# Patient Record
Sex: Female | Born: 1975 | Race: White | Hispanic: No | Marital: Single | Smoking: Former smoker
Health system: Southern US, Community
[De-identification: ages and names within clinical notes are randomized; demographics above are authoritative.]

## PROBLEM LIST (undated history)

## (undated) DIAGNOSIS — Z972 Presence of dental prosthetic device (complete) (partial): Secondary | ICD-10-CM

## (undated) DIAGNOSIS — N200 Calculus of kidney: Secondary | ICD-10-CM

## (undated) DIAGNOSIS — E079 Disorder of thyroid, unspecified: Secondary | ICD-10-CM

## (undated) HISTORY — PX: ABDOMINAL HYSTERECTOMY: SHX81

## (undated) HISTORY — PX: TUBAL LIGATION: SHX77

## (undated) HISTORY — PX: SPLENECTOMY: SUR1306

## (undated) HISTORY — DX: Calculus of kidney: N20.0

## (undated) HISTORY — PX: LITHOTRIPSY: SUR834

---

## 2004-12-23 ENCOUNTER — Emergency Department: Payer: Self-pay | Admitting: Emergency Medicine

## 2005-08-14 ENCOUNTER — Emergency Department: Payer: Self-pay | Admitting: Unknown Physician Specialty

## 2006-06-15 ENCOUNTER — Ambulatory Visit: Payer: Self-pay | Admitting: Unknown Physician Specialty

## 2007-11-28 ENCOUNTER — Ambulatory Visit: Payer: Self-pay | Admitting: *Deleted

## 2008-01-02 ENCOUNTER — Ambulatory Visit: Payer: Self-pay | Admitting: Obstetrics & Gynecology

## 2008-01-11 ENCOUNTER — Ambulatory Visit: Payer: Self-pay | Admitting: Obstetrics & Gynecology

## 2008-01-28 ENCOUNTER — Emergency Department: Payer: Self-pay | Admitting: Emergency Medicine

## 2008-02-02 ENCOUNTER — Ambulatory Visit: Payer: Self-pay | Admitting: Urology

## 2008-05-17 ENCOUNTER — Emergency Department: Payer: Self-pay | Admitting: Emergency Medicine

## 2008-05-27 ENCOUNTER — Emergency Department: Payer: Self-pay | Admitting: Emergency Medicine

## 2008-06-28 ENCOUNTER — Ambulatory Visit: Payer: Self-pay | Admitting: Urology

## 2008-11-16 DIAGNOSIS — N200 Calculus of kidney: Secondary | ICD-10-CM

## 2008-11-16 HISTORY — DX: Calculus of kidney: N20.0

## 2009-01-24 ENCOUNTER — Emergency Department: Payer: Self-pay | Admitting: Emergency Medicine

## 2009-05-07 ENCOUNTER — Emergency Department: Payer: Self-pay | Admitting: Emergency Medicine

## 2009-07-30 ENCOUNTER — Emergency Department: Payer: Self-pay | Admitting: Emergency Medicine

## 2011-02-11 ENCOUNTER — Emergency Department: Payer: Self-pay | Admitting: Emergency Medicine

## 2012-04-12 ENCOUNTER — Emergency Department: Payer: Self-pay | Admitting: Emergency Medicine

## 2012-07-29 ENCOUNTER — Emergency Department: Payer: Self-pay | Admitting: Emergency Medicine

## 2012-07-29 LAB — COMPREHENSIVE METABOLIC PANEL
Albumin: 4.1 g/dL (ref 3.4–5.0)
Bilirubin,Total: 0.6 mg/dL (ref 0.2–1.0)
Chloride: 107 mmol/L (ref 98–107)
Co2: 23 mmol/L (ref 21–32)
Creatinine: 0.54 mg/dL — ABNORMAL LOW (ref 0.60–1.30)
EGFR (African American): >60
EGFR (Non-African Amer.): >60
Osmolality: 277 (ref 275–301)
Potassium: 4.3 mmol/L (ref 3.5–5.1)
SGPT (ALT): 23 U/L (ref 12–78)
Sodium: 140 mmol/L (ref 136–145)
Total Protein: 7.2 g/dL (ref 6.4–8.2)

## 2012-07-29 LAB — URINALYSIS, COMPLETE
Bilirubin,UR: NEGATIVE
Nitrite: NEGATIVE
Ph: 6 (ref 4.5–8.0)
Protein: NEGATIVE
RBC,UR: 1 /HPF (ref 0–5)
Squamous Epithelial: 2

## 2012-07-29 LAB — CBC
MCHC: 35.7 g/dL (ref 32.0–36.0)
MCV: 80 fL (ref 80–100)
Platelet: 581 10*3/uL — ABNORMAL HIGH (ref 150–440)
RDW: 15.1 % — ABNORMAL HIGH (ref 11.5–14.5)
WBC: 15.7 10*3/uL — ABNORMAL HIGH (ref 3.6–11.0)

## 2013-01-03 ENCOUNTER — Ambulatory Visit: Payer: Self-pay | Admitting: Obstetrics & Gynecology

## 2013-01-03 LAB — CBC
HCT: 34.1 % — ABNORMAL LOW (ref 35.0–47.0)
HGB: 11.7 g/dL — ABNORMAL LOW (ref 12.0–16.0)
MCH: 24.3 pg — ABNORMAL LOW (ref 26.0–34.0)
MCHC: 34.3 g/dL (ref 32.0–36.0)
MCV: 71 fL — ABNORMAL LOW (ref 80–100)
Platelet: 677 10*3/uL — ABNORMAL HIGH (ref 150–440)
RBC: 4.8 10*6/uL (ref 3.80–5.20)
RDW: 17.1 % — ABNORMAL HIGH (ref 11.5–14.5)
WBC: 11.7 10*3/uL — ABNORMAL HIGH (ref 3.6–11.0)

## 2013-01-10 ENCOUNTER — Ambulatory Visit: Payer: Self-pay | Admitting: Obstetrics & Gynecology

## 2013-01-11 LAB — PATHOLOGY REPORT

## 2013-02-15 ENCOUNTER — Emergency Department: Payer: Self-pay | Admitting: Emergency Medicine

## 2013-02-15 LAB — COMPREHENSIVE METABOLIC PANEL
Albumin: 3.5 g/dL (ref 3.4–5.0)
Alkaline Phosphatase: 86 U/L (ref 50–136)
Anion Gap: 9 (ref 7–16)
BUN: 6 mg/dL — ABNORMAL LOW (ref 7–18)
Bilirubin,Total: 0.3 mg/dL (ref 0.2–1.0)
Calcium, Total: 9 mg/dL (ref 8.5–10.1)
Creatinine: 0.45 mg/dL — ABNORMAL LOW (ref 0.60–1.30)
EGFR (African American): >60
EGFR (Non-African Amer.): >60
Potassium: 3.8 mmol/L (ref 3.5–5.1)
SGOT(AST): 22 U/L (ref 15–37)
Sodium: 137 mmol/L (ref 136–145)
Total Protein: 7.1 g/dL (ref 6.4–8.2)

## 2013-02-15 LAB — LIPASE, BLOOD: Lipase: 109 U/L (ref 73–393)

## 2013-02-15 LAB — URINALYSIS, COMPLETE
Bilirubin,UR: NEGATIVE
Glucose,UR: NEGATIVE mg/dL (ref 0–75)
Nitrite: NEGATIVE
Ph: 7 (ref 4.5–8.0)
Protein: NEGATIVE
RBC,UR: 1 /HPF (ref 0–5)
Specific Gravity: 1.006 (ref 1.003–1.030)
Squamous Epithelial: 2
WBC UR: 7 /HPF (ref 0–5)

## 2013-02-15 LAB — CBC
MCH: 24.9 pg — ABNORMAL LOW (ref 26.0–34.0)
MCHC: 34.6 g/dL (ref 32.0–36.0)
MCV: 72 fL — ABNORMAL LOW (ref 80–100)
Platelet: 550 10*3/uL — ABNORMAL HIGH (ref 150–440)
RBC: 4.83 10*6/uL (ref 3.80–5.20)
WBC: 15.2 10*3/uL — ABNORMAL HIGH (ref 3.6–11.0)

## 2013-02-20 ENCOUNTER — Emergency Department: Payer: Self-pay | Admitting: Emergency Medicine

## 2013-02-20 LAB — BASIC METABOLIC PANEL
Anion Gap: 6 — ABNORMAL LOW (ref 7–16)
BUN: 9 mg/dL (ref 7–18)
Calcium, Total: 9.2 mg/dL (ref 8.5–10.1)
Chloride: 107 mmol/L (ref 98–107)
Creatinine: 0.59 mg/dL — ABNORMAL LOW (ref 0.60–1.30)
EGFR (African American): >60
EGFR (Non-African Amer.): >60
Glucose: 96 mg/dL (ref 65–99)
Osmolality: 272 (ref 275–301)
Potassium: 4.1 mmol/L (ref 3.5–5.1)

## 2013-02-20 LAB — CBC
HCT: 41.1 % (ref 35.0–47.0)
HGB: 14 g/dL (ref 12.0–16.0)
MCH: 24.5 pg — ABNORMAL LOW (ref 26.0–34.0)
MCHC: 34.2 g/dL (ref 32.0–36.0)
MCV: 72 fL — ABNORMAL LOW (ref 80–100)
RBC: 5.74 10*6/uL — ABNORMAL HIGH (ref 3.80–5.20)
RDW: 19 % — ABNORMAL HIGH (ref 11.5–14.5)

## 2013-02-21 LAB — URINALYSIS, COMPLETE
Bacteria: NONE SEEN
Bilirubin,UR: NEGATIVE
Ketone: NEGATIVE
Nitrite: NEGATIVE
Ph: 7 (ref 4.5–8.0)
Protein: NEGATIVE
RBC,UR: 16 /HPF (ref 0–5)
Specific Gravity: 1.013 (ref 1.003–1.030)

## 2013-02-21 LAB — WET PREP, GENITAL

## 2015-03-08 NOTE — Op Note (Signed)
PATIENT NAME:  Mallory Deleon, Mallory Deleon MR#:  [id number] DATE OF BIRTH:  1976-05-20  DATE OF PROCEDURE:  01/10/2013  PREOPERATIVE DIAGNOSES: Pelvic pain, menorrhagia, paratubal cyst, family history of cervical cancer.   POSTOPERATIVE DIAGNOSES: Pelvic pain, menorrhagia, paratubal cyst, family history of cervical cancer.   PROCEDURE PERFORMED: Complete laparoscopic hysterectomy, and right salpingectomy.   SURGEON: Harris.   ANESTHESIA: General.   BLOOD LOSS: 25 mL.    COMPLICATIONS: None.   FINDINGS: Cystic right tube, absent left tube and ovary. Normal right ovary, appendix, gallbladder and liver.   DISPOSITION: To the recovery room in stable condition.   TECHNIQUE: The patient was prepped and draped in the usual sterile fashion after adequate anesthesia was obtained in the dorsal lithotomy position. A Foley catheter was inserted. The cervix was visualized and grasped with a tenaculum. A VCare device was placed on the cervix after the uterus was sounded to 8 cm for manipulation purposes.   Attention was then turned to the abdomen, where a Veress needle was inserted through a 5-mm  infraumbilical incision after Marcaine was used to anesthetize the skin. Veress needle placement was confirmed using the hanging-drop technique, and the abdomen was then insufflated with CO2 gas. A 5-mm  trocar was then inserted under direct visualization with the laparoscope, with no injuries or bleeding noted. The patient was noted to have some omental adhesions around the area of the trocar placement, but no bowel is in this area and there is no apparent bowel injury.   The patient was placed in Trendelenburg positioning.   An 11-mm  trocar was placed in the right lower quadrant and a 5-mm trocar was placed in the left lower quadrant lateral to the inferior epigastric blood vessels, with no injuries or bleeding noted. The camera was moved into the left lower quadrant trocar site and was used to visualize the umbilical  trocar entry-point and the omental adhesions. Using the 5-mm  Harmonic scalpel, a lysis of adhesions was performed without complication or bleeding problems.   The camera was returned to the midline port, and the above-mentioned findings are visualized with the uterus and adnexa. The right fallopian tube was grasped and carefully dissected along its mesosalpinx to preserve blood supply to the right ovary. The round ligaments were carefully coagulated and transected using the 5-mm Harmonic scalpel, and the uteroovarian blood vessels and ligaments on both sides were carefully coagulated and transected with dissection down the level of the broad ligament complex to the level of the uterine arteries. The uterine arteries were then carefully coagulated using bipolar cautery device, and incised. Careful dissection around the circumference of the uterus was performed with inferior retraction and dissection of the bladder. 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 It was then removed vaginally.   Using the Endo Stitch device the vaginal cuff was then closed with 6 interrupted sutures, with excellent closure noted by visualization as well as palpation by bimanual examination. The pelvic cavity was irrigated, with aspiration of all fluid, blood clot, and excellent hemostasis noted. There was no apparent injury or problem with ureter, bowel, or bladder organs.   The patient was leveled. Gas was expelled. Trocars were removed. The skin was closed with Dermabond. Foley catheter was left in place. The patient went to the recovery room in stable condition.   All sponge,  instrument, and needle counts were correct at the conclusion of the case.     ____________________________ R. Barnett Applebaum, MD rph:dm D: 01/10/2013 11:35:00  ET T: 01/10/2013 12:34:44 ET JOB#: [id number]  cc: Glean Salen, MD, <Dictator> Gae Dry MD ELECTRONICALLY SIGNED 01/12/2013 8:59

## 2016-06-30 DIAGNOSIS — R197 Diarrhea, unspecified: Secondary | ICD-10-CM | POA: Insufficient documentation

## 2017-03-12 ENCOUNTER — Encounter: Payer: Self-pay | Admitting: Nurse Practitioner

## 2017-03-12 ENCOUNTER — Ambulatory Visit (INDEPENDENT_AMBULATORY_CARE_PROVIDER_SITE_OTHER): Payer: 59 | Admitting: Nurse Practitioner

## 2017-03-12 VITALS — BP 128/83 | HR 77 | Temp 98.3°F | Resp 16 | Ht 59.5 in | Wt 178.6 lb

## 2017-03-12 DIAGNOSIS — Z720 Tobacco use: Secondary | ICD-10-CM | POA: Diagnosis not present

## 2017-03-12 DIAGNOSIS — R0789 Other chest pain: Secondary | ICD-10-CM | POA: Diagnosis not present

## 2017-03-12 DIAGNOSIS — Z716 Tobacco abuse counseling: Secondary | ICD-10-CM | POA: Diagnosis not present

## 2017-03-12 DIAGNOSIS — G5603 Carpal tunnel syndrome, bilateral upper limbs: Secondary | ICD-10-CM

## 2017-03-12 DIAGNOSIS — Z7689 Persons encountering health services in other specified circumstances: Secondary | ICD-10-CM

## 2017-03-12 NOTE — Progress Notes (Signed)
Subjective:    Patient ID: Mallory Deleon, female    DOB: 03-19-1976, 41 y.o.   MRN: [id number]  Mallory Deleon is a 41 y.o. female presenting on 03/12/2017 for New Patient (Initial Visit) (Patient here to establish care. Patient reports feeling fairly well. Patient reports bilateral hand pain and numbness x's several years. ) and Chest Pain (Patient reports chest pain on and off for about one year. 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  She notices it most with repetitive movements.  She denies burning or shooting pain or any neck pain.  Chest pain Feels this sharp to aching pain at the bottom of her sternum when she slouches and it goes away when she stretches.  Stretches then hears a pop, then stops hurting.  This has been happening for over 1 year.  Some palpitations when she drinks mountain dew. Recent cardiac workup "ok".  No changes in the pain character with upper arm movements.  No pattern to the onset or experience of pain except for it occurring at times when she has a slouching posture.  The pain can lasts for seconds to days.  She notices it "needs to pop" to go away.  It is not tender.  Yoga stretching does improve frequency and shortens length of duration of the musculoskeletal pain.   Past Medical History:  Diagnosis Date  . Kidney stones 2010   Past Surgical History:  Procedure Laterality Date  . ABDOMINAL HYSTERECTOMY    . LITHOTRIPSY Bilateral   . SPLENECTOMY    . TUBAL LIGATION     Social History   Social History  . Marital status: Single    Spouse name: N/A  . Number of children: 3  . Years of education: N/A   Occupational History  . Not on file.     Social History Main Topics  . Smoking status: Current Every Day Smoker    Packs/day: 1.50    Years: 25.00    Types: Cigarettes  . Smokeless tobacco: Never Used     Comment: smokes when she is bored and stressed  . Alcohol use No     Comment: rarely  . Drug use: Yes    Frequency: 6.0 times per week    Types: Marijuana  . Sexual activity: Yes   Other Topics Concern  . Not on file   Social History Narrative  . No narrative on file   Family History  Problem Relation Age of Onset  . Diabetes Mother   . Diabetes Father   . Heart disease Father     CABGx3  . Thyroid disease Sister     hypothyroid  . Hereditary spherocytosis Daughter   . Hereditary spherocytosis Son    No current outpatient prescriptions on file prior to visit.   No current facility-administered medications on file prior to visit.     Review of Systems Per HPI unless specifically indicated above     Objective:    BP 128/83 (BP Location: Left Arm, Patient Position: Sitting, Cuff Size: Large)   Pulse 77   Temp 98.3 F (36.8 C) (Oral)   Resp 16  Ht 4' 11.5" (1.511 m)   Wt 178 lb 9.6 oz (81 kg)   SpO2 98%   BMI 35.47 kg/m   Wt Readings from Last 3 Encounters:  03/12/17 178 lb 9.6 oz (81 kg)    Physical Exam  Constitutional: She is oriented to person, place, and time. She appears well-developed and well-nourished. No distress.  HENT:  Head: Normocephalic and atraumatic.  Right Ear: Tympanic membrane, external ear and ear canal normal.  Left Ear: Tympanic membrane, external ear and ear canal normal.  Nose: Nose normal.  Mouth/Throat: Oropharynx is clear and moist.  Eyes: Conjunctivae and EOM are normal. Pupils are equal, round, and reactive to light.  Neck: Normal range of motion. No JVD present. No tracheal deviation present.  Cardiovascular: Normal rate, regular rhythm, normal heart sounds and intact distal pulses.   Pulmonary/Chest: Effort normal and breath sounds normal. No respiratory  distress. She has no wheezes. She has no rales. She exhibits no mass, no tenderness, no bony tenderness, no crepitus, no edema, no deformity, no swelling and no retraction.  Abdominal: Soft. Bowel sounds are normal.  Musculoskeletal: Normal range of motion.  R hand: tinel positive (dominant hand) L hand: tinel negative Phalen test: hands started tingling  Lymphadenopathy:    She has no cervical adenopathy.  Neurological: She is alert and oriented to person, place, and time. No cranial nerve deficit. Coordination normal.  Skin: Skin is warm and dry.  Psychiatric: She has a normal mood and affect. Her behavior is normal. Judgment and thought content normal.  Vitals reviewed.       Assessment & Plan:   Problem List Items Addressed This Visit    None    Visit Diagnoses    Musculoskeletal chest pain    -  Primary Stable and not worsening musculoskeletal pain.  Posture precipitates pain.  Pain resolves after popping sound at sternum and ribcage.  Plan: 1. Yoga stretches help relieve pain.  Continue. 2. Reviewed cardiac emergencies and instructed to have patient seek care via EMS.  If character changes without cardiac symptoms, ok to seek care in clinic.    Encounter to establish care with new doctor     Reviewed medical history, family history, social history.  Encounter for smoking cessation Ready to quit smoking.  Pt stated quit date set for June 13, 2017.  Discussion today >10 minutes specifically on counseling on risks of tobacco use, complications, treatment, smoking cessation. Plan:  1. Discussed tips for quitting and discussed pharmaceutical options to help quitting if unable to do it without.   2. Plan for Wellbutrin if needing pharmaceutical assistance.    Bilateral carpal tunnel syndrome  Worsening progression over recent months.  Problem onset about 8 years ago.  Plan: 1. Initiate NSAID and other antiinflammatory therapy with heat/ice. 2. Initiate OT referral and  treatment as needed ([facility] order provided). 3. Consider Orthopedic Surgery referral for discussion r/t splinting vs surgical release.          Meds ordered this encounter  Medications  . ibuprofen (ADVIL,MOTRIN) 200 MG tablet    Sig: Take 200 mg by mouth as needed for mild pain.      Follow up plan: Return in about 1 month (around 04/11/2017) for 1-2 months for annual physical.  Cassell Smiles, DNP, AGPCNP-BC Adult Gerontology Primary Care Nurse Practitioner [facility] 03/14/2017, 9:23 PM

## 2017-03-12 NOTE — Patient Instructions (Signed)
Ave, Thank you for coming in to clinic today.  1. To quit smoking, set quit date. Work toward reducing by half if you can't completely quit.  July 29th.    2. For your carpal tunnel Right hand. - take ibuprofen 600 mg every 8 hours with food. - OT referral has been placed. - Consider orthopedic surgery referral for discussions about splinting vs. Surgery.  3. FOr your chest/sternum pain - Keep doing your stretches with yoga. - If the pain character changes, seek care here, or via 9-1-1 for chest pressure, sweating, lightheadedness, arm pain, back pain, or other jaw pain.  It could be cardiac.   Please schedule a follow-up appointment with Cassell Smiles, AGNP in 1 months for your annual physical.  If you have any other questions or concerns, please feel free to call the clinic or send a message through [facility]. You may also schedule an earlier appointment if necessary.  Cassell Smiles, DNP, AGNP-BC Adult Gerontology Nurse Practitioner [facility]   Carpal Tunnel Syndrome Carpal tunnel syndrome is a condition that causes pain in your hand and arm. The carpal tunnel is a narrow area located on the palm side of your wrist. Repeated wrist motion or certain diseases may cause swelling within the tunnel. This swelling pinches the main nerve in the wrist (median nerve). What are the causes? This condition may be caused by:  Repeated wrist motions.  Wrist injuries.  Arthritis.  A cyst or tumor in the carpal tunnel.  Fluid buildup during pregnancy. Sometimes the cause of this condition is not known. What increases the risk? This condition is more likely to develop in:  People who have jobs that cause them to repeatedly move their wrists in the same motion, such as Art gallery manager.  Women.  People with certain conditions, such as:  Diabetes.  Obesity.  An underactive thyroid (hypothyroidism).  Kidney failure. What are the signs or  symptoms? Symptoms of this condition include:  A tingling feeling in your fingers, especially in your thumb, index, and middle fingers.  Tingling or numbness in your hand.  An aching feeling in your entire arm, especially when your wrist and elbow are bent for long periods of time.  Wrist pain that goes up your arm to your shoulder.  Pain that goes down into your palm or fingers.  A weak feeling in your hands. You may have trouble grabbing and holding items. Your symptoms may feel worse during the night. How is this diagnosed? This condition is diagnosed with a medical history and physical exam. You may also have tests, including:  An electromyogram (EMG). This test measures electrical signals sent by your nerves into the muscles.  X-rays. How is this treated? Treatment for this condition includes:  Lifestyle changes. It is important to stop doing or modify the activity that caused your condition.  Physical or occupational therapy.  Medicines for pain and inflammation. This may include medicine that is injected into your wrist.  A wrist splint.  Surgery. Follow these instructions at home: If you have a splint:   Wear it as told by your health care provider. Remove it only as told by your health care provider.  Loosen the splint if your fingers become numb and tingle, or if they turn cold and blue.  Keep the splint clean and dry. General instructions   Take over-the-counter and prescription medicines only as told by your health care provider.  Rest your wrist from any activity that may  be causing your pain. If your condition is work related, talk to your employer about changes that can be made, such as getting a wrist pad to use while typing.  If directed, apply ice to the painful area:  Put ice in a plastic bag.  Place a towel between your skin and the bag.  Leave the ice on for 20 minutes, 2-3 times per day.  Keep all follow-up visits as told by your health  care provider. This is important.  Do any exercises as told by your health care provider, physical therapist, or occupational therapist. Contact a health care provider if:  You have new symptoms.  Your pain is not controlled with medicines.  Your symptoms get worse. This information is not intended to replace advice given to you by your health care provider. Make sure you discuss any questions you have with your health care provider. Document Released: 10/30/2000 Document Revised: 03/12/2016 Document Reviewed: 03/20/2015 Elsevier Interactive Patient Education  2017 [employer].   Steps to Quit Smoking Smoking tobacco can be harmful to your health and can affect almost every organ in your body. Smoking puts you, and those around you, at risk for developing many serious chronic diseases. Quitting smoking is difficult, but it is one of the best things that you can do for your health. It is never too late to quit. What are the benefits of quitting smoking? When you quit smoking, you lower your risk of developing serious diseases and conditions, such as:  Lung cancer or lung disease, such as COPD.  Heart disease.  Stroke.  Heart attack.  Infertility.  Osteoporosis and bone fractures. Additionally, symptoms such as coughing, wheezing, and shortness of breath may get better when you quit. You may also find that you get sick less often because your body is stronger at fighting off colds and infections. If you are pregnant, quitting smoking can help to reduce your chances of having a baby of low birth weight. How do I get ready to quit? When you decide to quit smoking, create a plan to make sure that you are successful. Before you quit:  Pick a date to quit. Set a date within the next two weeks to give you time to prepare.  Write down the reasons why you are quitting. Keep this list in places where you will see it often, such as on your bathroom mirror or in your car or  wallet.  Identify the people, places, things, and activities that make you want to smoke (triggers) and avoid them. Make sure to take these actions:  Throw away all cigarettes at home, at work, and in your car.  Throw away smoking accessories, such as Scientist, research (medical).  Clean your car and make sure to empty the ashtray.  Clean your home, including curtains and carpets.  Tell your family, friends, and coworkers that you are quitting. Support from your loved ones can make quitting easier.  Talk with your health care provider about your options for quitting smoking.  Find out what treatment options are covered by your health insurance. What strategies can I use to quit smoking? Talk with your healthcare provider about different strategies to quit smoking. Some strategies include:  Quitting smoking altogether instead of gradually lessening how much you smoke over a period of time. Research shows that quitting "cold [place]" is more successful than gradually quitting.  Attending in-person counseling to help you build problem-solving skills. You are more likely to have success in quitting if you  attend several counseling sessions. Even short sessions of 10 minutes can be effective.  Finding resources and support systems that can help you to quit smoking and remain smoke-free after you quit. These resources are most helpful when you use them often. They can include:  Online chats with a Social worker.  Telephone quitlines.  Printed Furniture conservator/restorer.  Support groups or group counseling.  Text messaging programs.  Mobile phone applications.  Taking medicines to help you quit smoking. (If you are pregnant or breastfeeding, talk with your health care provider first.) Some medicines contain nicotine and some do not. Both types of medicines help with cravings, but the medicines that include nicotine help to relieve withdrawal symptoms. Your health care provider may recommend:  Nicotine  patches, gum, or lozenges.  Nicotine inhalers or sprays.  Non-nicotine medicine that is taken by mouth. Talk with your health care provider about combining strategies, such as taking medicines while you are also receiving in-person counseling. Using these two strategies together makes you more likely to succeed in quitting than if you used either strategy on its own. If you are pregnant or breastfeeding, talk with your health care provider about finding counseling or other support strategies to quit smoking. Do not take medicine to help you quit smoking unless told to do so by your health care provider. What things can I do to make it easier to quit? Quitting smoking might feel overwhelming at first, but there is a lot that you can do to make it easier. Take these important actions:  Reach out to your family and friends and ask that they support and encourage you during this time. Call telephone quitlines, reach out to support groups, or work with a counselor for support.  Ask people who smoke to avoid smoking around you.  Avoid places that trigger you to smoke, such as bars, parties, or smoke-break areas at work.  Spend time around people who do not smoke.  Lessen stress in your life, because stress can be a smoking trigger for some people. To lessen stress, try:  Exercising regularly.  Deep-breathing exercises.  Yoga.  Meditating.  Performing a body scan. This involves closing your eyes, scanning your body from head to toe, and noticing which parts of your body are particularly tense. Purposefully relax the muscles in those areas.  Download or purchase mobile phone or tablet apps (applications) that can help you stick to your quit plan by providing reminders, tips, and encouragement. There are many free apps, such as QuitGuide from the [employer] Office manager for Disease Control and Prevention). You can find other support for quitting smoking (smoking cessation) through smokefree.gov and other  websites. How will I feel when I quit smoking? Within the first 24 hours of quitting smoking, you may start to feel some withdrawal symptoms. These symptoms are usually most noticeable 2-3 days after quitting, but they usually do not last beyond 2-3 weeks. Changes or symptoms that you might experience include:  Mood swings.  Restlessness, anxiety, or irritation.  Difficulty concentrating.  Dizziness.  Strong cravings for sugary foods in addition to nicotine.  Mild weight gain.  Constipation.  Nausea.  Coughing or a sore throat.  Changes in how your medicines work in your body.  A depressed mood.  Difficulty sleeping (insomnia). After the first 2-3 weeks of quitting, you may start to notice more positive results, such as:  Improved sense of smell and taste.  Decreased coughing and sore throat.  Slower heart rate.  Lower blood pressure.  Clearer skin.  The ability to breathe more easily.  Fewer sick days. Quitting smoking is very challenging for most people. Do not get discouraged if you are not successful the first time. Some people need to make many attempts to quit before they achieve long-term success. Do your best to stick to your quit plan, and talk with your health care provider if you have any questions or concerns. This information is not intended to replace advice given to you by your health care provider. Make sure you discuss any questions you have with your health care provider. Document Released: 10/27/2001 Document Revised: 06/30/2016 Document Reviewed: 03/19/2015 Elsevier Interactive Patient Education  2017 [employer].

## 2017-03-14 ENCOUNTER — Encounter: Payer: Self-pay | Admitting: Nurse Practitioner

## 2017-03-15 NOTE — Progress Notes (Signed)
I have reviewed this encounter including the documentation in this note and/or discussed this patient with the provider, Cassell Smiles, AGPCNP-BC. I am certifying that I agree with the content of this note as supervising physician.  Nobie Putnam, [facility] 03/15/2017, 8:24 AM

## 2017-03-26 ENCOUNTER — Encounter: Payer: Self-pay | Admitting: Nurse Practitioner

## 2017-03-26 ENCOUNTER — Ambulatory Visit (INDEPENDENT_AMBULATORY_CARE_PROVIDER_SITE_OTHER): Payer: 59 | Admitting: Nurse Practitioner

## 2017-03-26 VITALS — BP 117/72 | HR 78 | Temp 98.2°F | Resp 16 | Ht 59.25 in | Wt 177.2 lb

## 2017-03-26 DIAGNOSIS — Z0000 Encounter for general adult medical examination without abnormal findings: Secondary | ICD-10-CM

## 2017-03-26 DIAGNOSIS — Z23 Encounter for immunization: Secondary | ICD-10-CM

## 2017-03-26 DIAGNOSIS — G43019 Migraine without aura, intractable, without status migrainosus: Secondary | ICD-10-CM

## 2017-03-26 DIAGNOSIS — E6609 Other obesity due to excess calories: Secondary | ICD-10-CM

## 2017-03-26 DIAGNOSIS — Z6835 Body mass index (BMI) 35.0-35.9, adult: Secondary | ICD-10-CM | POA: Diagnosis not present

## 2017-03-26 MED ORDER — SUMATRIPTAN 5 MG/ACT NA SOLN: 1.0000 | NASAL | 0 refills | Status: DC | PRN

## 2017-03-26 NOTE — Patient Instructions (Addendum)
Zelina, Thank you for coming in to clinic today.  1. For your annual physical, Your exam is normal today.   - Weight loss goals would be to lose about 20 lbs in 1 year.   - Use the MyPlate guidance to increase vegetables and fruits.  Reduce sugars, fried foods, and fats. - Continue toward your path to smoking cessation.  We can meet around 8/1 for a check up and provide any medical assistance you may want after trying to quit without medicines. - Get fasting labs at this clinic between 8a-noon sometime M-W of next week.  2. For your migraine: - Start sumatriptan nasal spray one spray in one nostril every 2 hours for a max of 3 doses in 24 hours at onset of migraine.  Please schedule a follow-up appointment with Cassell Smiles, AGNP to Return in about 3 months (around 06/16/2017) for smoking cessation.  If you have any other questions or concerns, please feel free to call the clinic or send a message through [place]. You may also schedule an earlier appointment if necessary.  Cassell Smiles, DNP, AGNP-BC Adult Gerontology Nurse Practitioner [facility], [place] [facility], healthful diet is based on the 2010 Dietary Guidelines for Americans. The amount of food you need to eat from each food group depends on your age, sex, and level of physical activity and can be individualized by a dietitian. Go to [employer] for more information. What do I need to know about the MyPlate plan?  Enjoy your food, but eat less.  Avoid oversized portions.   of your plate should include fruits and vegetables.   of your plate should be grains.   of your plate should be protein. Grains   Make at least half of your grains whole grains.  For a 2,000 calorie daily food plan, eat 6 oz every day.  1 oz is about 1 slice bread, 1 cup cereal, or  cup cooked rice, cereal, or pasta. Vegetables   Make half your plate fruits and vegetables.  For a 2,000 calorie  daily food plan, eat 2 cups every day.  1 cup is about 1 cup raw or cooked vegetables or vegetable juice or 2 cups raw leafy greens. Fruits   Make half your plate fruits and vegetables.  For a 2,000 calorie daily food plan, eat 2 cups every day.  1 cup is about 1 cup fruit or 100% fruit juice or  cup dried fruit. Protein   For a 2,000 calorie daily food plan, eat 5 oz every day.  1 oz is about 1 oz meat, poultry, or fish,  cup cooked beans, 1 egg, 1 Tbsp peanut butter, or  oz nuts or seeds. Dairy   Switch to fat-free or low-fat (1%) milk.  For a 2,000 calorie daily food plan, eat 3 cups every day.  1 cup is about 1 cup milk or yogurt or soy milk (soy beverage), 1 oz natural cheese, or 2 oz processed cheese. Fats, Oils, and Empty Calories   Only small amounts of oils are recommended.  Empty calories are calories from solid fats or added sugars.  Compare sodium in foods like soup, bread, and frozen meals. Choose the foods with lower numbers.  Drink water instead of sugary drinks. What foods can I eat? Grains  Whole grains such as whole wheat, quinoa, millet, and bulgur. Bread, rolls, and pasta made from whole grains. Brown or wild rice. Hot or cold cereals made from whole  grains and without added sugar. Vegetables  All fresh vegetables, especially fresh red, dark green, or orange vegetables. Peas and beans. Low-sodium frozen or canned vegetables prepared without added salt. Low-sodium vegetable juices. Fruits  All fresh, frozen, and dried fruits. Canned fruit packed in water or fruit juice without added sugar. Fruit juices without added sugar. Meats and Other Protein Sources  Boiled, baked, or grilled lean meat trimmed of fat. Skinless poultry. Fresh seafood and shellfish. Canned seafood packed in water. Unsalted nuts and unsalted nut butters. Tofu. Dried beans and pea. Eggs. Dairy  Low-fat or fat-free milk, yogurt, and cheeses. Sweets and Desserts  Frozen desserts made  from low-fat milk. Fats and Oils  Olive, peanut, and canola oils and margarine. Salad dressing and mayonnaise made from these oils. Other  Soups and casseroles made from allowed ingredients and without added fat or salt. The items listed above may not be a complete list of recommended foods or beverages. Contact your dietitian for more options.  What foods are not recommended? Grains  Sweetened, low-fiber cereals. Packaged baked goods. Snack crackers and chips. Cheese crackers, butter crackers, and biscuits. Frozen waffles, sweet breads, doughnuts, pastries, packaged baking mixes, pancakes, cakes, and cookies. Vegetables  Regular canned or frozen vegetables or vegetables prepared with salt. Canned tomatoes. Canned tomato sauce. Fried vegetables. Vegetables in cream sauce or cheese sauce. Fruits  Fruits packed in syrup or made with added sugar. Meats and Other Protein Sources  Marbled or fatty meats such as ribs. Poultry with skin. Fried meats, poultry, eggs, or fish. Sausages, hot dogs, and deli meats such as pastrami, bologna, or salami. Dairy  Whole milk, cream, cheeses made from whole milk, sour cream. Ice cream or yogurt made from whole milk or with added sugar. Beverages  For adults, no more than one alcoholic drink per day. Regular soft drinks or other sugary beverages. Juice drinks. Sweets and Desserts  Sugary or fatty desserts, candy, and other sweets. Fats and Oils  Solid shortening or partially hydrogenated oils. Solid margarine. Margarine that contains trans fats. Butter. The items listed above may not be a complete list of foods and beverages to avoid. Contact your dietitian for more information.  This information is not intended to replace advice given to you by your health care provider. Make sure you discuss any questions you have with your health care provider. Document Released: 11/22/2007 Document Revised: 04/09/2016 Document Reviewed: 10/11/2013 Elsevier Interactive  Patient Education  2017 [employer].

## 2017-03-26 NOTE — Progress Notes (Signed)
Subjective:    Patient ID: Mallory Deleon, female    DOB: 24-Sep-1976, 41 y.o.   MRN: [id number]  Mallory Deleon is a 41 y.o. female presenting on 03/26/2017 for Annual Exam (Patient here today for annual exam. Patient reports feeling well and exercising 2 days a week. Patient reports sleeping well.)   HPI  Annual Physical Exam Patient has been feeling pretty well.  She does have a migraine today.  She notes a possible stomach virus that is resolving.  She had diarrhea 2x this week Monday (3 x BM) Wed (2 x BM).  Stomach virus is "going around" at her full time job.  She has had a poor appetite and is eating onlycrackers and soups.  Did eat [employer] biscuit yesterday with gas, bloating and felt full all day.  Migraine She awoke this morning with a migraine and didn't go to work.  She hasn't taken anything for it yet.  Ibuprofen doesn't always work.  She is having associated photophobia.  She has taken an abortive medication in past, but doesn't remember the name.  Vaginal dryness/pain  First occurred with intercourse for about 1 minute last Saturday.  This week if she turns her upper body "a certain way" she still has a stabbing pain that is sudden and lasts a few seconds.    Health Maintenance Weight/BMI: Obese Physical activity: less cleaning, plan to get outside and start hiking again. Diet: Pt states one thing she could improve is consumption of mountain dew Seatbelt: always Sunscreen: sometimes PAP: hysterectomy, no cervix Tetanus: last given > 10 years ago Sleeps 5-6 hours per night interrupted x1 for bathroom.   Quick update on last visit: Smoking Cessation: Pt states she has been smoking more this week r/t increased stress.   MSK Chest Pain: She hasn't had any this week. Carpal Tunnel: [facility]'s referral lost so she hasn't been.  She quit 2nd job and carpal tunnel is much better.  Takes ibuprofen right before full time job and is helping.  Started doing less repetitve movements  at her job as well.    Past Medical History:  Diagnosis Date  . Kidney stones 2010   Past Surgical History:  Procedure Laterality Date  . ABDOMINAL HYSTERECTOMY    . LITHOTRIPSY Bilateral   . SPLENECTOMY    . TUBAL LIGATION     Social History   Social History  . Marital status: Single    Spouse name: N/A  . Number of children: 3  . Years of education: N/A   Occupational History  . Not on file.   Social History Main Topics  . Smoking status: Current Every Day Smoker    Packs/day: 1.50    Years: 25.00    Types: Cigarettes  . Smokeless tobacco: Never Used     Comment: smokes when she is bored and stressed  . Alcohol use No     Comment: rarely  . Drug use: Yes    Frequency: 6.0 times per week    Types: Marijuana  . Sexual activity: Yes   Other Topics Concern  . Not on file   Social History Narrative  . No narrative on file   Family History  Problem Relation Age of Onset  . Diabetes Mother   . Diabetes Father   . Heart disease Father        CABGx3  . Thyroid disease Sister        hypothyroid  . Hereditary spherocytosis Daughter   . Hereditary  spherocytosis Son    Current Outpatient Prescriptions on File Prior to Visit  Medication Sig  . ibuprofen (ADVIL,MOTRIN) 200 MG tablet Take 200 mg by mouth as needed for mild pain.   No current facility-administered medications on file prior to visit.     Review of Systems  Constitutional: Negative.   HENT: Negative.   Eyes: Negative.   Respiratory: Negative.   Cardiovascular: Negative.   Gastrointestinal: Positive for diarrhea. Negative for nausea and vomiting.  Endocrine: Negative.   Genitourinary: Positive for vaginal pain.  Musculoskeletal: Negative.   Skin: Negative.   Allergic/Immunologic: Negative.   Neurological: Negative.   Hematological: Negative.   Psychiatric/Behavioral: Negative.    Per HPI unless specifically indicated above      Objective:    BP 117/72 (BP Location: Right Arm,  Patient Position: Sitting, Cuff Size: Large)   Pulse 78   Temp 98.2 F (36.8 C) (Oral)   Resp 16   Ht 4' 11.25" (1.505 m)   Wt 177 lb 3.2 oz (80.4 kg)   SpO2 97%   BMI 35.49 kg/m   Wt Readings from Last 3 Encounters:  03/26/17 177 lb 3.2 oz (80.4 kg)  03/12/17 178 lb 9.6 oz (81 kg)    Physical Exam  Constitutional: She appears well-developed and well-nourished. No distress.  HENT:  Head: Normocephalic and atraumatic.  Right Ear: External ear normal.  Left Ear: External ear normal.  Nose: Nose normal.  Mouth/Throat: Oropharynx is clear and moist.  Eyes: Conjunctivae and EOM are normal. Pupils are equal, round, and reactive to light.  Neck: Normal range of motion. Neck supple. No JVD present. No tracheal deviation present. No thyromegaly present.  Cardiovascular: Normal rate, regular rhythm, normal heart sounds and intact distal pulses.   Pulmonary/Chest: Effort normal. No respiratory distress. She has wheezes.  Inspiratory Wheezing LLL  Abdominal: Soft. Bowel sounds are normal.  Genitourinary: Vagina normal. No vaginal discharge found.  Genitourinary Comments: No adnexal tenderness or enlargement.  Minimal tenderness to internal vaginal palpation toward R in R pelvis.  No R ovary remains.  Musculoskeletal: Normal range of motion.  Lymphadenopathy:    She has no cervical adenopathy.  Neurological: She is alert. No cranial nerve deficit.  Skin: Skin is warm and dry.  Psychiatric: She has a normal mood and affect. Her behavior is normal. Judgment and thought content normal.  Vitals reviewed.      Assessment & Plan:   Problem List Items Addressed This Visit      Other   Annual physical exam Well adult.  Annual health maintenance reviewed.  Plan: 1. No pap smear r/t no cervix s/p hysterectomy. 2. Administer tetanus vaccine. 3. Check labs.   Relevant Orders   Comprehensive metabolic panel   CBC with Differential/Platelet   Hemoglobin A1c   Lipid panel   TSH    Tetanus toxoid vaccination administered at current visit Last tetanus over 10 years ago.  Plan: 1. Administer Tdap.   Relevant Orders   Tdap vaccine greater than or equal to 41yo IM    Other Visit Diagnoses    BMI 35.0-35.9,adult    -  Primary Obese.  Pt stable weight over 2 weeks.  Plan: 1. Discussed healthy weight and proper nutrition. 2. Pt not interested in pursuing weight loss today r/t work on smoking cessation.   Relevant Orders   Comprehensive metabolic panel   Hemoglobin A1c   Lipid panel   TSH   Intractable migraine without aura and without status migrainosus  Active migraine.  Pt with prior prophylaxis, none now.  Desires prophylaxis for migraines that occur approximately 2x per month.  Plan: 1. Use sumatriptan 5 mg/actuation.  Use one spray in one nostril every 2 hours for up to 3 doses in 24 hours.   Relevant Medications   SUMAtriptan (IMITREX) 5 MG/ACT nasal spray      No orders of the defined types were placed in this encounter.     Follow up plan: Return in about 3 months (around 06/16/2017) for smoking cessation.  Cassell Smiles, DNP, AGPCNP-BC Adult Gerontology Primary Care Nurse Practitioner [facility] 03/26/2017, 2:29 PM

## 2017-03-29 ENCOUNTER — Other Ambulatory Visit: Payer: 59

## 2017-03-29 NOTE — Progress Notes (Signed)
I have reviewed this encounter including the documentation in this note and/or discussed this patient with the provider, Cassell Smiles, AGPCNP-BC. I am certifying that I agree with the content of this note as supervising physician.  Nobie Putnam, [facility] 03/29/2017, 6:37 AM

## 2017-03-31 ENCOUNTER — Ambulatory Visit (INDEPENDENT_AMBULATORY_CARE_PROVIDER_SITE_OTHER): Payer: 59

## 2017-03-31 DIAGNOSIS — Z23 Encounter for immunization: Secondary | ICD-10-CM

## 2017-04-01 LAB — TSH: TSH: 5.42 mIU/L — ABNORMAL HIGH

## 2017-04-01 LAB — CBC WITH DIFFERENTIAL/PLATELET
Basophils Absolute: 0 cells/uL (ref 0–200)
Basophils Relative: 0 %
Eosinophils Absolute: 342 cells/uL (ref 15–500)
Eosinophils Relative: 2 %
HCT: 40.3 % (ref 35.0–45.0)
Hemoglobin: 13.7 g/dL (ref 11.7–15.5)
Lymphocytes Relative: 40 %
Lymphs Abs: 6840 cells/uL — ABNORMAL HIGH (ref 850–3900)
MCH: 29.8 pg (ref 27.0–33.0)
MCHC: 34 g/dL (ref 32.0–36.0)
MCV: 87.8 fL (ref 80.0–100.0)
MPV: 9.3 fL (ref 7.5–12.5)
Monocytes Absolute: 1197 cells/uL — ABNORMAL HIGH (ref 200–950)
Monocytes Relative: 7 %
Neutro Abs: 8721 cells/uL — ABNORMAL HIGH (ref 1500–7800)
Neutrophils Relative %: 51 %
Platelets: 573 10*3/uL — ABNORMAL HIGH (ref 140–400)
RBC: 4.59 MIL/uL (ref 3.80–5.10)
RDW: 14.3 % (ref 11.0–15.0)
WBC: 17.1 10*3/uL — ABNORMAL HIGH (ref 3.8–10.8)

## 2017-04-01 LAB — LIPID PANEL
Cholesterol: 184 mg/dL (ref <200)
HDL: 36 mg/dL — ABNORMAL LOW (ref >50)
LDL Cholesterol: 113 mg/dL — ABNORMAL HIGH (ref <100)
Total CHOL/HDL Ratio: 5.1 Ratio — ABNORMAL HIGH (ref <5.0)
Triglycerides: 176 mg/dL — ABNORMAL HIGH (ref <150)
VLDL: 35 mg/dL — ABNORMAL HIGH (ref <30)

## 2017-04-01 LAB — COMPREHENSIVE METABOLIC PANEL
ALT: 15 U/L (ref 6–29)
AST: 15 U/L (ref 10–30)
Albumin: 4 g/dL (ref 3.6–5.1)
Alkaline Phosphatase: 88 U/L (ref 33–115)
BUN: 11 mg/dL (ref 7–25)
CO2: 24 mmol/L (ref 20–31)
Calcium: 9.3 mg/dL (ref 8.6–10.2)
Chloride: 106 mmol/L (ref 98–110)
Creat: 0.62 mg/dL (ref 0.50–1.10)
Glucose, Bld: 116 mg/dL — ABNORMAL HIGH (ref 65–99)
Potassium: 3.9 mmol/L (ref 3.5–5.3)
Sodium: 141 mmol/L (ref 135–146)
Total Bilirubin: 0.7 mg/dL (ref 0.2–1.2)
Total Protein: 6.3 g/dL (ref 6.1–8.1)

## 2017-04-01 LAB — HEMOGLOBIN A1C
Hgb A1c MFr Bld: 5.2 % (ref <5.7)
Mean Plasma Glucose: 103 mg/dL

## 2017-04-02 ENCOUNTER — Other Ambulatory Visit: Payer: Self-pay

## 2017-04-02 ENCOUNTER — Other Ambulatory Visit: Payer: Self-pay | Admitting: Nurse Practitioner

## 2017-04-02 DIAGNOSIS — E039 Hypothyroidism, unspecified: Secondary | ICD-10-CM

## 2017-04-02 MED ORDER — LEVOTHYROXINE SODIUM 25 MCG PO TABS: 25.0000 ug | ORAL_TABLET | Freq: Every day | ORAL | 2 refills | Status: DC

## 2017-04-02 NOTE — Addendum Note (Signed)
Addended by: Cleaster Corin on: 04/02/2017 01:42 PM   Modules accepted: Orders

## 2017-04-20 ENCOUNTER — Encounter: Payer: Self-pay | Admitting: Nurse Practitioner

## 2017-06-17 DIAGNOSIS — E039 Hypothyroidism, unspecified: Secondary | ICD-10-CM | POA: Diagnosis not present

## 2017-06-18 ENCOUNTER — Other Ambulatory Visit: Payer: Self-pay

## 2017-06-18 DIAGNOSIS — E039 Hypothyroidism, unspecified: Secondary | ICD-10-CM

## 2017-06-19 LAB — TSH: TSH: 1.66 mIU/L

## 2017-07-10 ENCOUNTER — Encounter: Payer: Self-pay | Admitting: Nurse Practitioner

## 2017-07-12 MED ORDER — LEVOTHYROXINE SODIUM 25 MCG PO TABS: 25.0000 ug | ORAL_TABLET | Freq: Every day | ORAL | 5 refills | Status: DC

## 2017-07-26 ENCOUNTER — Encounter: Payer: Self-pay | Admitting: Nurse Practitioner

## 2017-08-13 ENCOUNTER — Encounter: Payer: Self-pay | Admitting: Obstetrics & Gynecology

## 2017-08-13 ENCOUNTER — Ambulatory Visit (INDEPENDENT_AMBULATORY_CARE_PROVIDER_SITE_OTHER): Payer: BLUE CROSS/BLUE SHIELD | Admitting: Obstetrics & Gynecology

## 2017-08-13 VITALS — BP 120/80 | HR 87 | Ht 59.0 in | Wt 185.0 lb

## 2017-08-13 DIAGNOSIS — Z01419 Encounter for gynecological examination (general) (routine) without abnormal findings: Secondary | ICD-10-CM

## 2017-08-13 DIAGNOSIS — Z124 Encounter for screening for malignant neoplasm of cervix: Secondary | ICD-10-CM

## 2017-08-13 DIAGNOSIS — Z0000 Encounter for general adult medical examination without abnormal findings: Secondary | ICD-10-CM

## 2017-08-13 DIAGNOSIS — Z1239 Encounter for other screening for malignant neoplasm of breast: Secondary | ICD-10-CM

## 2017-08-13 DIAGNOSIS — Z1231 Encounter for screening mammogram for malignant neoplasm of breast: Secondary | ICD-10-CM | POA: Diagnosis not present

## 2017-08-13 NOTE — Patient Instructions (Signed)
PAP every three years Mammogram every year    Call [phone] to schedule at [facility] yearly (with PCP)   

## 2017-08-13 NOTE — Progress Notes (Signed)
HPI:      [name] is a 41 y.o. [phone] who LMP was in the past, she presents today for her annual examination.  The patient has no complaints today. The patient is sexually active. Herlast pap: approximate date 2015 and was normal and last mammogram: approximate date 2015 and was normal.  The patient does perform self breast exams.  There is no notable family history of breast or ovarian cancer in her family. The patient is not taking hormone replacement therapy. Patient denies post-menopausal vaginal bleeding.   The patient has regular exercise: yes. The patient denies current symptoms of depression.  Some bladder urgency at times.  GYN Hx: Last Colonoscopy:never ago. Normal.  Last DEXA: never ago.    PMHx: Past Medical History:  Diagnosis Date  . Kidney stones 2010   Past Surgical History:  Procedure Laterality Date  . ABDOMINAL HYSTERECTOMY    . LITHOTRIPSY Bilateral   . SPLENECTOMY    . TUBAL LIGATION     Family History  Problem Relation Age of Onset  . Diabetes Mother   . Diabetes Father   . Heart disease Father        CABGx3  . Thyroid disease Sister        hypothyroid  . Hereditary spherocytosis Daughter   . Hereditary spherocytosis Son    Social History  Substance Use Topics  . Smoking status: Current Every Day Smoker    Packs/day: 1.50    Years: 25.00    Types: Cigarettes  . Smokeless tobacco: Never Used     Comment: smokes when she is bored and stressed  . Alcohol use No     Comment: rarely    Current Outpatient Prescriptions:  .  ibuprofen (ADVIL,MOTRIN) 200 MG tablet, Take 200 mg by mouth as needed for mild pain., Disp: , Rfl:  .  levothyroxine (SYNTHROID, LEVOTHROID) 25 MCG tablet, Take 1 tablet (25 mcg total) by mouth daily., Disp: 30 tablet, Rfl: 5 .  SUMAtriptan (IMITREX) 5 MG/ACT nasal spray, Place 1 spray (5 mg total) into the nose every 2 (two) hours as needed for migraine. Max 3 doses in 24 hours (Patient not taking: Reported on  08/13/2017), Disp: 1 Inhaler, Rfl: 0 Allergies: Naproxen sodium and Sulfa antibiotics  Review of Systems  Constitutional: Negative for chills, fever and malaise/fatigue.  HENT: Negative for congestion, sinus pain and sore throat.   Eyes: Negative for blurred vision and pain.  Respiratory: Negative for cough and wheezing.   Cardiovascular: Negative for chest pain and leg swelling.  Gastrointestinal: Negative for abdominal pain, constipation, diarrhea, heartburn, nausea and vomiting.  Genitourinary: Negative for dysuria, frequency, hematuria and urgency.  Musculoskeletal: Negative for back pain, joint pain, myalgias and neck pain.  Skin: Negative for itching and rash.  Neurological: Negative for dizziness, tremors and weakness.  Endo/Heme/Allergies: Does not bruise/bleed easily.  Psychiatric/Behavioral: Negative for depression. The patient is not nervous/anxious and does not have insomnia.     Objective: BP 120/80   Pulse 87   Ht 4\' 11"  (1.499 m)   Wt 185 lb (83.9 kg)   BMI 37.37 kg/m   Filed Weights   08/13/17 1029  Weight: 185 lb (83.9 kg)   Body mass index is 37.37 kg/m. Physical Exam  Constitutional: She is oriented to person, place, and time. She appears well-developed and well-nourished. No distress.  Genitourinary: Rectum normal and vagina normal. Pelvic exam was performed with patient supine. There is no rash or lesion on the right  labia. There is no rash or lesion on the left labia. Vagina exhibits no lesion. No bleeding in the vagina. Right adnexum does not display mass and does not display tenderness. Left adnexum does not display mass and does not display tenderness.  Genitourinary Comments: Absent Uterus Absent cervix Vaginal cuff well healed  HENT:  Head: Normocephalic and atraumatic. Head is without laceration.  Right Ear: Hearing normal.  Left Ear: Hearing normal.  Nose: No epistaxis.  No foreign bodies.  Mouth/Throat: Uvula is midline, oropharynx is clear and  moist and mucous membranes are normal.  Eyes: Pupils are equal, round, and reactive to light.  Neck: Normal range of motion. Neck supple. No thyromegaly present.  Cardiovascular: Normal rate and regular rhythm.  Exam reveals no gallop and no friction rub.   No murmur heard. Pulmonary/Chest: Effort normal and breath sounds normal. No respiratory distress. She has no wheezes. Right breast exhibits no mass, no skin change and no tenderness. Left breast exhibits no mass, no skin change and no tenderness.  Abdominal: Soft. Bowel sounds are normal. She exhibits no distension. There is no tenderness. There is no rebound.  Musculoskeletal: Normal range of motion.  Neurological: She is alert and oriented to person, place, and time. No cranial nerve deficit.  Skin: Skin is warm and dry.  Psychiatric: She has a normal mood and affect. Judgment normal.  Vitals reviewed.  Assessment: Annual Exam 1. Annual physical exam   2. Screening for breast cancer   3. Screening for cervical cancer    Plan:            1.  Cervical Screening-  Pap smear done today  2. Breast screening- Exam annually and mammogram scheduled  3. Labs managed by PCP  4. Counseling for hormonal therapy: none, no change in therapy today    F/U  Return in about 1 year (around 08/13/2018) for Annual.  Barnett Applebaum, MD, [facility], [facility] 08/13/2017  10:53 AM

## 2017-08-15 LAB — PAP IG (IMAGE GUIDED): PAP Smear Comment: 0

## 2017-12-20 ENCOUNTER — Encounter: Payer: Self-pay | Admitting: Emergency Medicine

## 2017-12-20 ENCOUNTER — Other Ambulatory Visit: Payer: Self-pay

## 2017-12-20 ENCOUNTER — Ambulatory Visit: Payer: 59 | Admitting: Nurse Practitioner

## 2017-12-20 ENCOUNTER — Emergency Department
Admission: EM | Admit: 2017-12-20 | Discharge: 2017-12-20 | Disposition: A | Discharge status: Home / Self Care | Payer: BLUE CROSS/BLUE SHIELD | Attending: Emergency Medicine | Admitting: Emergency Medicine

## 2017-12-20 DIAGNOSIS — Z79899 Other long term (current) drug therapy: Secondary | ICD-10-CM | POA: Insufficient documentation

## 2017-12-20 DIAGNOSIS — F17210 Nicotine dependence, cigarettes, uncomplicated: Secondary | ICD-10-CM | POA: Insufficient documentation

## 2017-12-20 DIAGNOSIS — R110 Nausea: Secondary | ICD-10-CM | POA: Diagnosis not present

## 2017-12-20 DIAGNOSIS — R109 Unspecified abdominal pain: Secondary | ICD-10-CM

## 2017-12-20 DIAGNOSIS — R1084 Generalized abdominal pain: Secondary | ICD-10-CM | POA: Diagnosis not present

## 2017-12-20 DIAGNOSIS — R197 Diarrhea, unspecified: Secondary | ICD-10-CM | POA: Diagnosis not present

## 2017-12-20 HISTORY — DX: Disorder of thyroid, unspecified: E07.9

## 2017-12-20 LAB — URINALYSIS, COMPLETE (UACMP) WITH MICROSCOPIC
BACTERIA UA: NONE SEEN
Bilirubin Urine: NEGATIVE
Glucose, UA: NEGATIVE mg/dL
KETONES UR: NEGATIVE mg/dL
Leukocytes, UA: NEGATIVE
Nitrite: NEGATIVE
Protein, ur: NEGATIVE mg/dL
Specific Gravity, Urine: 1.002 — ABNORMAL LOW (ref 1.005–1.030)
pH: 6 (ref 5.0–8.0)

## 2017-12-20 LAB — CBC
HEMATOCRIT: 41.8 % (ref 35.0–47.0)
HEMOGLOBIN: 14.8 g/dL (ref 12.0–16.0)
MCH: 29.7 pg (ref 26.0–34.0)
MCHC: 35.4 g/dL (ref 32.0–36.0)
MCV: 83.9 fL (ref 80.0–100.0)
Platelets: 656 10*3/uL — ABNORMAL HIGH (ref 150–440)
RBC: 4.99 MIL/uL (ref 3.80–5.20)
RDW: 14.2 % (ref 11.5–14.5)
WBC: 15.8 10*3/uL — AB (ref 3.6–11.0)

## 2017-12-20 LAB — COMPREHENSIVE METABOLIC PANEL
ALT: 23 U/L (ref 14–54)
AST: 29 U/L (ref 15–41)
Albumin: 4.4 g/dL (ref 3.5–5.0)
Alkaline Phosphatase: 94 U/L (ref 38–126)
Anion gap: 8 (ref 5–15)
BUN: 9 mg/dL (ref 6–20)
CHLORIDE: 105 mmol/L (ref 101–111)
CO2: 23 mmol/L (ref 22–32)
Calcium: 9.6 mg/dL (ref 8.9–10.3)
Creatinine, Ser: 0.68 mg/dL (ref 0.44–1.00)
GFR calc Af Amer: >60 mL/min (ref >60)
Glucose, Bld: 133 mg/dL — ABNORMAL HIGH (ref 65–99)
POTASSIUM: 3.9 mmol/L (ref 3.5–5.1)
Sodium: 136 mmol/L (ref 135–145)
Total Bilirubin: 0.5 mg/dL (ref 0.3–1.2)
Total Protein: 8 g/dL (ref 6.5–8.1)

## 2017-12-20 LAB — POCT PREGNANCY, URINE: PREG TEST UR: NEGATIVE

## 2017-12-20 LAB — LIPASE, BLOOD: LIPASE: 28 U/L (ref 11–51)

## 2017-12-20 MED ORDER — ONDANSETRON 4 MG PO TBDP: 4.0000 mg | ORAL_TABLET | Freq: Three times a day (TID) | ORAL | 0 refills | Status: DC | PRN

## 2017-12-20 MED ORDER — LOPERAMIDE HCL 2 MG PO CAPS
2.0000 mg | ORAL_CAPSULE | Freq: Once | ORAL | Status: AC
  Administered 2017-12-20: 2 mg via ORAL
  Filled 2017-12-20: qty 1

## 2017-12-20 MED ORDER — DICYCLOMINE HCL 10 MG PO CAPS
20.0000 mg | ORAL_CAPSULE | Freq: Once | ORAL | Status: AC
  Administered 2017-12-20: 20 mg via ORAL
  Filled 2017-12-20: qty 2

## 2017-12-20 MED ORDER — DICYCLOMINE HCL 20 MG PO TABS
20.0000 mg | ORAL_TABLET | Freq: Three times a day (TID) | ORAL | 0 refills | Status: DC | PRN
Start: 2017-12-20 — End: 2021-06-20

## 2017-12-20 NOTE — ED Provider Notes (Signed)
[facility] Emergency Department Provider Note  ____________________________________________   First MD Initiated Contact with Patient 12/20/17 1106     (approximate)  I have reviewed the triage vital signs and the nursing notes.   HISTORY  Chief Complaint Abdominal Cramping   HPI Mallory Deleon is a 42 y.o. female is self presents to the emergency department with intermittent moderate severity cramping diffuse abdominal pain and nausea along with several loose stools and is vomited several times.  The patient's symptoms began this morning although she has had intermittent symptoms for several months.  She has never been given a clear diagnosis.  She denies fevers or chills.  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  Nothing currently seems to make her symptoms better or worse.  Past Medical History:  Diagnosis Date  . Kidney stones 2010  . Thyroid disease     Patient Active Problem List   Diagnosis Date Noted  . Annual physical exam 03/26/2017  . Tetanus toxoid vaccination administered at current visit 03/26/2017    Past Surgical History:  Procedure Laterality Date  . ABDOMINAL HYSTERECTOMY    . LITHOTRIPSY Bilateral   . SPLENECTOMY    . TUBAL LIGATION      Prior to Admission medications   Medication Sig Start Date End Date Taking? Authorizing Provider  dicyclomine (BENTYL) 20 MG tablet Take 1 tablet (20 mg total) by mouth 3 (three) times daily as needed for spasms. 12/20/17 12/20/18  Darel Hong, MD  ibuprofen (ADVIL,MOTRIN) 200 MG tablet Take 200 mg by mouth as needed for mild pain.    [provider]  levothyroxine (SYNTHROID, LEVOTHROID) 25 MCG tablet Take 1 tablet (25 mcg total) by mouth daily. 07/12/17   Mikey College, NP  ondansetron (ZOFRAN ODT) 4 MG disintegrating tablet Take 1 tablet (4 mg total) by mouth every 8 (eight) hours as needed for nausea or  vomiting. 12/20/17   Darel Hong, MD  SUMAtriptan (IMITREX) 5 MG/ACT nasal spray Place 1 spray (5 mg total) into the nose every 2 (two) hours as needed for migraine. Max 3 doses in 24 hours Patient not taking: Reported on 08/13/2017 03/26/17   Mikey College, NP    Allergies Naproxen sodium and Sulfa antibiotics  Family History  Problem Relation Age of Onset  . Diabetes Mother   . Diabetes Father   . Heart disease Father        CABGx3  . Thyroid disease Sister        hypothyroid  . Hereditary spherocytosis Daughter   . Hereditary spherocytosis Son     Social History Social History   Tobacco Use  . Smoking status: Current Every Day Smoker    Packs/day: 1.50    Years: 25.00    Pack years: 37.50    Types: Cigarettes  . Smokeless tobacco: Never Used  . Tobacco comment: smokes when she is bored and stressed  Substance Use Topics  . Alcohol use: No    Comment: rarely  . Drug use: Yes    Frequency: 6.0 times per week    Types: Marijuana    Review of Systems Constitutional: No fever/chills Eyes: No visual changes. ENT: No sore throat. Cardiovascular: Denies chest pain. Respiratory: Denies shortness of breath. Gastrointestinal: Positive for abdominal pain.  Positive for nausea, no vomiting.  Positive for diarrhea.  No constipation. Genitourinary: Negative for dysuria. Musculoskeletal: Negative for back pain. Skin: Negative for rash. Neurological:  Negative for headaches, focal weakness or numbness.   ____________________________________________   PHYSICAL EXAM:  VITAL SIGNS: ED Triage Vitals  Enc Vitals Group     BP 12/20/17 1023 (!) 144/99     Pulse Rate 12/20/17 1023 86     Resp 12/20/17 1023 18     Temp 12/20/17 1023 99 F (37.2 C)     Temp Source 12/20/17 1023 Oral     SpO2 12/20/17 1023 96 %     Weight 12/20/17 1024 172 lb (78 kg)     Height 12/20/17 1024 5' (1.524 m)     Head Circumference --      Peak Flow --      Pain Score 12/20/17 1023 8       Pain Loc --      Pain Edu? --      Excl. in [place]? --     Constitutional: Alert and oriented x4 appears somewhat uncomfortable nontoxic no diaphoresis speaks in full clear sentences Eyes: PERRL EOMI. Head: Atraumatic. Nose: No congestion/rhinnorhea. Mouth/Throat: No trismus Neck: No stridor.   Cardiovascular: Normal rate, regular rhythm. Grossly normal heart sounds.  Good peripheral circulation. Respiratory: Normal respiratory effort.  No retractions. Lungs CTAB and moving good air Gastrointestinal: Soft nondistended nontender no rebound or guarding no peritonitis no McBurney's tenderness negative Rovsing's no costovertebral tenderness Musculoskeletal: No lower extremity edema   Neurologic:  Normal speech and language. No gross focal neurologic deficits are appreciated. Skin:  Skin is warm, dry and intact. No rash noted. Psychiatric: Mood and affect are normal. Speech and behavior are normal.    ____________________________________________   DIFFERENTIAL includes but not limited to  Appendicitis, diverticulitis, renal colic, nephrolithiasis, small bowel obstruction ____________________________________________   LABS (all labs ordered are listed, but only abnormal results are displayed)  Labs Reviewed  COMPREHENSIVE METABOLIC PANEL - Abnormal; Notable for the following components:      Result Value   Glucose, Bld 133 (*)    All other components within normal limits  CBC - Abnormal; Notable for the following components:   WBC 15.8 (*)    Platelets 656 (*)    All other components within normal limits  URINALYSIS, COMPLETE (UACMP) WITH MICROSCOPIC - Abnormal; Notable for the following components:   Color, Urine COLORLESS (*)    APPearance CLEAR (*)    Specific Gravity, Urine 1.002 (*)    Hgb urine dipstick SMALL (*)    Squamous Epithelial / LPF 0-5 (*)    All other components within normal limits  LIPASE, BLOOD  POCT PREGNANCY, URINE    Lab work reviewed by me shows  elevated white count which is nonspecific and likely secondary to pain __________________________________________  EKG   ____________________________________________  RADIOLOGY   ____________________________________________   PROCEDURES  Procedure(s) performed: no  Procedures  Critical Care performed: no  Observation: no ____________________________________________   INITIAL IMPRESSION / ASSESSMENT AND PLAN / ED COURSE  Pertinent labs & imaging results that were available during my care of the patient were reviewed by me and considered in my medical decision making (see chart for details).  Patient has chronic abdominal pain and nausea.  Symptoms improved after dicyclomine and Zofran.  Her abdominal exam is benign.  A lengthy discussion with the patient regarding advanced imaging and together we both agree she does not warranted at this time.  I will refer her to gastroenterology as an outpatient.  Options have been given and the patient verbalizes understanding and agree with plan.  ___________----------------------------------------- 12:21 PM on 12/20/2017 -----------------------------------------  The patient's symptoms are now nearly completely resolved.  Will prescribe her a short course of Zofran and dicyclomine for home as well as refer her to gastroenterology for her chronic intermittent cramping abdominal discomfort.  _________________________________   FINAL CLINICAL IMPRESSION(S) / ED DIAGNOSES  Final diagnoses:  Abdominal cramping  Diarrhea, unspecified type      NEW MEDICATIONS STARTED DURING THIS VISIT:  Discharge Medication List as of 12/20/2017 12:21 PM    START taking these medications   Details  dicyclomine (BENTYL) 20 MG tablet Take 1 tablet (20 mg total) by mouth 3 (three) times daily as needed for spasms., Starting Mon 12/20/2017, Until Tue 12/20/2018, Print    ondansetron (ZOFRAN ODT) 4 MG disintegrating tablet Take 1 tablet (4 mg total)  by mouth every 8 (eight) hours as needed for nausea or vomiting., Starting Mon 12/20/2017, Print         Note:  This document was prepared using Dragon voice recognition software and may include unintentional dictation errors.     Darel Hong, MD 12/22/17 1254

## 2017-12-20 NOTE — ED Triage Notes (Signed)
Pt from home , nausea and loose stool , vomting (not since Sat), this AM awoke with Abd cramping with continuous nausea

## 2017-12-20 NOTE — Discharge Instructions (Signed)
Please use your pain and nausea medication as needed for severe symptoms and make an appointment to follow-up with the GI doctor for reevaluation.  Return to the emergency department sooner for any concerns whatsoever.  It was a pleasure to take care of you today, and thank you for coming to our emergency department.  If you have any questions or concerns before leaving please ask the nurse to grab me and I'm more than happy to go through your aftercare instructions again.  If you were prescribed any opioid pain medication today such as Norco, Vicodin, Percocet, morphine, hydrocodone, or oxycodone please make sure you do not drive when you are taking this medication as it can alter your ability to drive safely.  If you have any concerns once you are home that you are not improving or are in fact getting worse before you can make it to your follow-up appointment, please do not hesitate to call 911 and come back for further evaluation.  Darel Hong, MD  Results for orders placed or performed during the hospital encounter of 12/20/17  Lipase, blood  Result Value Ref Range   Lipase 28 11 - 51 U/L  Comprehensive metabolic panel  Result Value Ref Range   Sodium 136 135 - 145 mmol/L   Potassium 3.9 3.5 - 5.1 mmol/L   Chloride 105 101 - 111 mmol/L   CO2 23 22 - 32 mmol/L   Glucose, Bld 133 (H) 65 - 99 mg/dL   BUN 9 6 - 20 mg/dL   Creatinine, Ser 0.68 0.44 - 1.00 mg/dL   Calcium 9.6 8.9 - 10.3 mg/dL   Total Protein 8.0 6.5 - 8.1 g/dL   Albumin 4.4 3.5 - 5.0 g/dL   AST 29 15 - 41 U/L   ALT 23 14 - 54 U/L   Alkaline Phosphatase 94 38 - 126 U/L   Total Bilirubin 0.5 0.3 - 1.2 mg/dL   GFR calc non Af Amer >60 >60 mL/min   GFR calc Af Amer >60 >60 mL/min   Anion gap 8 5 - 15  CBC  Result Value Ref Range   WBC 15.8 (H) 3.6 - 11.0 K/uL   RBC 4.99 3.80 - 5.20 MIL/uL   Hemoglobin 14.8 12.0 - 16.0 g/dL   HCT 41.8 35.0 - 47.0 %   MCV 83.9 80.0 - 100.0 fL   MCH 29.7 26.0 - 34.0 pg   MCHC 35.4  32.0 - 36.0 g/dL   RDW 14.2 11.5 - 14.5 %   Platelets 656 (H) 150 - 440 K/uL  Urinalysis, Complete w Microscopic  Result Value Ref Range   Color, Urine COLORLESS (A) YELLOW   APPearance CLEAR (A) CLEAR   Specific Gravity, Urine 1.002 (L) 1.005 - 1.030   pH 6.0 5.0 - 8.0   Glucose, UA NEGATIVE NEGATIVE mg/dL   Hgb urine dipstick SMALL (A) NEGATIVE   Bilirubin Urine NEGATIVE NEGATIVE   Ketones, ur NEGATIVE NEGATIVE mg/dL   Protein, ur NEGATIVE NEGATIVE mg/dL   Nitrite NEGATIVE NEGATIVE   Leukocytes, UA NEGATIVE NEGATIVE   RBC / HPF 0-5 0 - 5 RBC/hpf   WBC, UA 0-5 0 - 5 WBC/hpf   Bacteria, UA NONE SEEN NONE SEEN   Squamous Epithelial / LPF 0-5 (A) NONE SEEN   Mucus PRESENT   Pregnancy, urine POC  Result Value Ref Range   Preg Test, Ur NEGATIVE NEGATIVE

## 2017-12-20 NOTE — ED Notes (Signed)
Pt to ed with nausea and loose stool, nausea and vomiting, this am awoke with abd cramping.

## 2017-12-23 ENCOUNTER — Other Ambulatory Visit: Payer: Self-pay

## 2017-12-24 ENCOUNTER — Ambulatory Visit: Payer: BLUE CROSS/BLUE SHIELD | Admitting: Gastroenterology

## 2017-12-24 ENCOUNTER — Encounter: Payer: Self-pay | Admitting: Gastroenterology

## 2017-12-24 ENCOUNTER — Other Ambulatory Visit: Payer: Self-pay

## 2017-12-24 VITALS — BP 135/82 | HR 86 | Temp 98.6°F | Ht 59.0 in | Wt 186.0 lb

## 2017-12-24 DIAGNOSIS — R197 Diarrhea, unspecified: Secondary | ICD-10-CM | POA: Diagnosis not present

## 2017-12-24 DIAGNOSIS — G8929 Other chronic pain: Secondary | ICD-10-CM

## 2017-12-24 DIAGNOSIS — R1084 Generalized abdominal pain: Secondary | ICD-10-CM | POA: Diagnosis not present

## 2017-12-24 NOTE — Progress Notes (Signed)
Cephas Darby, MD [address]  [place]  [place], [place] [address]  Main: [phone]  Fax: [phone]    Gastroenterology Consultation  Referring Provider:     Mikey College, * Primary Care Physician:  Mikey College, NP Primary Gastroenterologist:  Dr. Cephas Darby Reason for Consultation:     Intermittent diarrhea and abdominal pain        HPI:   Mallory Deleon is a 42 y.o. female referred by Dr. Merrilyn Puma, Jerrel Ivory, NP  for consultation & management of several years history of intermittent diarrhea and abdominal pain. She reports having intermittent discrete episodes of upper abdominal pain, severe cramps associated with several episodes of diarrhea sometimes mixed with blood,nausea and nonbloody emesis. These episodes are discrete and occur about once a month or so, they've been more frequent in last few years. She was recently at [facility] ER last week. She was found to have leukocytosis and thrombocytosis. Lipase was normal, LFTs normal. Hemoglobin normal. She did not have any abdominal imaging. She was sent home on Bentyl which has been taking and resulted in gas, constipation. Her last bowel movement was 2 days ago. She had several CT scans in the past over last several years which did not reveal any small bowel inflammation. She does have history of nonobstructive nephrolithiasis. She denies having these symptoms in between these episodes. She mostly has good days otherwise without any pain. She generally has one bowel movement per day. She does feel bloated. Most recent episode has lasted for about a week. She denies weight loss, loss of appetite, fever, chills, arthralgias, rash. She had a splenectomy at age 8. She reports that the symptoms get worse when she is stressed. she is a chronic smoker. She denies travel history, antibiotic use or heavy NSAID use including BC powder or Goody powder.   NSAIDs: Ibuprofen as needed,  occasional  Antiplts/Anticoagulants/Anti thrombotics: none  GI Procedures: none she denies family history of IBD, GI malignancy She had splenectomy at age 48  Past Medical History:  Diagnosis Date  . Kidney stones 2010  . Thyroid disease     Past Surgical History:  Procedure Laterality Date  . ABDOMINAL HYSTERECTOMY    . LITHOTRIPSY Bilateral   . SPLENECTOMY    . TUBAL LIGATION       Current Outpatient Medications:  .  dicyclomine (BENTYL) 20 MG tablet, Take 1 tablet (20 mg total) by mouth 3 (three) times daily as needed for spasms., Disp: 30 tablet, Rfl: 0 .  levothyroxine (SYNTHROID, LEVOTHROID) 25 MCG tablet, Take 1 tablet (25 mcg total) by mouth daily., Disp: 30 tablet, Rfl: 5 .  ondansetron (ZOFRAN ODT) 4 MG disintegrating tablet, Take 1 tablet (4 mg total) by mouth every 8 (eight) hours as needed for nausea or vomiting., Disp: 20 tablet, Rfl: 0   Family History  Problem Relation Age of Onset  . Diabetes Mother   . Diabetes Father   . Heart disease Father        CABGx3  . Thyroid disease Sister        hypothyroid  . Hereditary spherocytosis Daughter   . Hereditary spherocytosis Son      Social History   Tobacco Use  . Smoking status: Current Every Day Smoker    Packs/day: 1.50    Years: 25.00    Pack years: 37.50    Types: Cigarettes  . Smokeless tobacco: Never Used  . Tobacco comment: smokes when  she is bored and stressed  Substance Use Topics  . Alcohol use: No    Comment: rarely  . Drug use: Yes    Frequency: 6.0 times per week    Types: Marijuana    Allergies as of 12/24/2017 - Review Complete 12/24/2017  Allergen Reaction Noted  . Naproxen sodium Itching and Rash 03/12/2017  . Sulfa antibiotics Hives and Rash 09/11/2014    Review of Systems:    All systems reviewed and negative except where noted in HPI.   Physical Exam:  BP 135/82   Pulse 86   Temp 98.6 F (37 C) (Oral)   Ht '4\' 11"'$  (1.499 m)   Wt 186 lb (84.4 kg)   BMI 37.57 kg/m    No LMP recorded. Patient has had a hysterectomy.  General:   Alert,  Well-developed, well-nourished, pleasant and cooperative in NAD Head:  Normocephalic and atraumatic. Eyes:  Sclera clear, no icterus.   Conjunctiva pink. Ears:  Normal auditory acuity. Nose:  No deformity, discharge, or lesions. Mouth:  No deformity or lesions,oropharynx pink & moist. Neck:  Supple; no masses or thyromegaly. Lungs:  Respirations even and unlabored.  Clear throughout to auscultation.   No wheezes, crackles, or rhonchi. No acute distress. Heart:  Regular rate and rhythm; no murmurs, clicks, rubs, or gallops. Abdomen:  Normal bowel sounds. Soft, mild diffuse tenderness and non-distended without masses, hepatosplenomegaly or hernias noted.  No guarding or rebound tenderness.   Rectal: Not performed Msk:  Symmetrical without gross deformities. Good, equal movement & strength bilaterally. Pulses:  Normal pulses noted. Extremities:  No clubbing or edema.  No cyanosis. Neurologic:  Alert and oriented x3;  grossly normal neurologically. Skin:  Intact without significant lesions or rashes. No jaundice. Lymph Nodes:  No significant cervical adenopathy. Psych:  Alert and cooperative. Normal mood and affect.  Imaging Studies: reviewed  Assessment and Plan:   Mallory Deleon is a 42 y.o. White female with history of hypothyroidism, splenectomy at age 52 presents with chronic intermittent episodes of upper abdominal pain, crampy associated with nausea, vomiting as well as diarrhea sometimes mixed with blood. Review of her labs from the past reveal that her symptoms correlate with leukocytosis as well as thrombocytosis dating back to 2012. It was a CT scan from 2011 at [facility] which revealed fecal is a patient of small bubble which may reflect delayed intestinal transit. However, other CT scans did not reveal any evidence of small bowel or colonic inflammation. Differentials include small bowel intussusception or inflammatory  bowel disease or eosinophilic enteritis or chronic infectious enteritis or IBS  - check CRP, ESR, stool studies for C. Difficile and other GI pathogens - Check fecal cal Protectin - Recommend MR enterography - Recommend EGD and colonoscopy based on the results of MR enterography - she can continue dicyclomine as needed for now - Encouraged her to work on quitting smoking   Follow up in 4 weeks   Cephas Darby, MD

## 2017-12-31 ENCOUNTER — Ambulatory Visit (HOSPITAL_COMMUNITY): Admission: RE | Admit: 2017-12-31 | Payer: BLUE CROSS/BLUE SHIELD | Source: Ambulatory Visit

## 2017-12-31 ENCOUNTER — Other Ambulatory Visit: Payer: Self-pay

## 2018-01-04 ENCOUNTER — Telehealth: Payer: Self-pay | Admitting: Gastroenterology

## 2018-01-04 NOTE — Telephone Encounter (Signed)
Mallory Deleon called from [employer] with the auth# for patient's abd/pelvic MRI.good for 30 days effective 01-04-18 auth# for the abd [id number].

## 2018-01-07 ENCOUNTER — Ambulatory Visit: Payer: BLUE CROSS/BLUE SHIELD

## 2018-01-24 ENCOUNTER — Telehealth: Payer: Self-pay | Admitting: Nurse Practitioner

## 2018-01-24 MED ORDER — LEVOTHYROXINE SODIUM 25 MCG PO TABS: 25.0000 ug | ORAL_TABLET | Freq: Every day | ORAL | 5 refills | Status: DC

## 2018-01-24 NOTE — Telephone Encounter (Signed)
Pt needs a refill on levothyroxine sent to [facility].

## 2018-04-28 ENCOUNTER — Encounter: Payer: Self-pay | Admitting: Nurse Practitioner

## 2018-06-27 ENCOUNTER — Other Ambulatory Visit: Payer: Self-pay

## 2018-06-27 ENCOUNTER — Ambulatory Visit (INDEPENDENT_AMBULATORY_CARE_PROVIDER_SITE_OTHER): Payer: BLUE CROSS/BLUE SHIELD | Admitting: Nurse Practitioner

## 2018-06-27 ENCOUNTER — Encounter: Payer: Self-pay | Admitting: Nurse Practitioner

## 2018-06-27 VITALS — BP 111/69 | HR 80 | Temp 98.6°F | Ht 59.0 in | Wt 176.4 lb

## 2018-06-27 DIAGNOSIS — Z0000 Encounter for general adult medical examination without abnormal findings: Secondary | ICD-10-CM

## 2018-06-27 DIAGNOSIS — F17210 Nicotine dependence, cigarettes, uncomplicated: Secondary | ICD-10-CM | POA: Diagnosis not present

## 2018-06-27 DIAGNOSIS — Z1239 Encounter for other screening for malignant neoplasm of breast: Secondary | ICD-10-CM

## 2018-06-27 DIAGNOSIS — Z1231 Encounter for screening mammogram for malignant neoplasm of breast: Secondary | ICD-10-CM

## 2018-06-27 DIAGNOSIS — E039 Hypothyroidism, unspecified: Secondary | ICD-10-CM | POA: Diagnosis not present

## 2018-06-27 DIAGNOSIS — Z716 Tobacco abuse counseling: Secondary | ICD-10-CM

## 2018-06-27 MED ORDER — NICOTINE 14 MG/24HR TD PT24: 14.0000 mg | MEDICATED_PATCH | Freq: Every day | TRANSDERMAL | 0 refills | Status: AC

## 2018-06-27 MED ORDER — NICOTINE 7 MG/24HR TD PT24: 7.0000 mg | MEDICATED_PATCH | Freq: Every day | TRANSDERMAL | 0 refills | Status: DC

## 2018-06-27 NOTE — Patient Instructions (Addendum)
Mallory Deleon,   Thank you for coming in to clinic today.  1. Continue your regular physical activity and healthy diet!  Great work on your weight loss.  2. Your mammogram order has been placed.  Call the Scheduling phone number at [phone] to schedule your mammogram at your convenience.  You can choose to go to either location listed below.  Let the scheduler know which location you prefer.  [place]  [place], [place] [address]   [facility] [address] [place], [place] [address]   3. You will be due for [place].  This means you should eat no food or drink after midnight.  Drink only water or coffee without cream/sugar on the morning of your lab visit. - Please go ahead and schedule a "Lab Only" visit in the morning at the clinic for lab draw in the next 7 days. - Your results will be available about 2-3 days after blood draw.  If you have set up a MyChart account, you can can log in to MyChart online to view your results and a brief explanation. Also, we can discuss your results together at your next office visit if you would like.   Please schedule a follow-up appointment with Cassell Smiles, AGNP. Return in about 1 year (around 06/28/2019) for annual physical AND 6 months for thyroid.  If you have any other questions or concerns, please feel free to call the clinic or send a message through [place]. You may also schedule an earlier appointment if necessary.  You will receive a survey after today's visit either digitally by e-mail or paper by [employer]. Your experiences and feedback matter to [place].  Please respond so we know how we are doing as we provide care for you.   Cassell Smiles, DNP, AGNP-BC Adult Gerontology Nurse Practitioner [facility] [place] with Quitting Smoking Quitting smoking is a physical and mental  challenge. You will face cravings, withdrawal symptoms, and temptation. Before quitting, work with your health care provider to make a plan that can help you cope. Preparation can help you quit and keep you from giving in. How can I cope with cravings? Cravings usually last for 5-10 minutes. If you get through it, the craving will pass. Consider taking the following actions to help you cope with cravings:  Keep your mouth busy: ? Chew sugar-free gum. ? Suck on hard candies or a straw. ? Brush your teeth.  Keep your hands and body busy: ? Immediately change to a different activity when you feel a craving. ? Squeeze or play with a ball. ? Do an activity or a hobby, like making bead jewelry, practicing needlepoint, or working with wood. ? Mix up your normal routine. ? Take a short exercise break. Go for a quick walk or run up and down stairs. ? Spend time in public places where smoking is not allowed.  Focus on doing something kind or helpful for someone else.  Call a friend or family member to talk during a craving.  Join a support group.  Call a quit line, such as 1-800-QUIT-NOW.  Talk with your health care provider about medicines that might help you cope with cravings and make quitting easier for you.  How can I deal with withdrawal symptoms? Your body may experience negative effects as it tries to get used to not having nicotine in the  system. These effects are called withdrawal symptoms. They may include:  Feeling hungrier than normal.  Trouble concentrating.  Irritability.  Trouble sleeping.  Feeling depressed.  Restlessness and agitation.  Craving a cigarette.  To manage withdrawal symptoms:  Avoid places, people, and activities that trigger your cravings.  Remember why you want to quit.  Get plenty of sleep.  Avoid coffee and other caffeinated drinks. These may worsen some of your symptoms.  How can I handle social situations? Social situations can be  difficult when you are quitting smoking, especially in the first few weeks. To manage this, you can:  Avoid parties, bars, and other social situations where people might be smoking.  Avoid alcohol.  Leave right away if you have the urge to smoke.  Explain to your family and friends that you are quitting smoking. Ask for understanding and support.  Plan activities with friends or family where smoking is not an option.  What are some ways I can cope with stress? Wanting to smoke may cause stress, and stress can make you want to smoke. Find ways to manage your stress. Relaxation techniques can help. For example:  Breathe slowly and deeply, in through your nose and out through your mouth.  Listen to soothing, relaxing music.  Talk with a family member or friend about your stress.  Light a candle.  Soak in a bath or take a shower.  Think about a peaceful place.  What are some ways I can prevent weight gain? Be aware that many people gain weight after they quit smoking. However, not everyone does. To keep from gaining weight, have a plan in place before you quit and stick to the plan after you quit. Your plan should include:  Having healthy snacks. When you have a craving, it may help to: ? Eat plain popcorn, crunchy carrots, celery, or other cut vegetables. ? Chew sugar-free gum.  Changing how you eat: ? Eat small portion sizes at meals. ? Eat 4-6 small meals throughout the day instead of 1-2 large meals a day. ? Be mindful when you eat. Do not watch television or do other things that might distract you as you eat.  Exercising regularly: ? Make time to exercise each day. If you do not have time for a long workout, do short bouts of exercise for 5-10 minutes several times a day. ? Do some form of strengthening exercise, like weight lifting, and some form of aerobic exercise, like running or swimming.  Drinking plenty of water or other low-calorie or no-calorie drinks. Drink 6-8  glasses of water daily, or as much as instructed by your health care provider.  Summary  Quitting smoking is a physical and mental challenge. You will face cravings, withdrawal symptoms, and temptation to smoke again. Preparation can help you as you go through these challenges.  You can cope with cravings by keeping your mouth busy (such as by chewing gum), keeping your body and hands busy, and making calls to family, friends, or a helpline for people who want to quit smoking.  You can cope with withdrawal symptoms by avoiding places where people smoke, avoiding drinks with caffeine, and getting plenty of rest.  Ask your health care provider about the different ways to prevent weight gain, avoid stress, and handle social situations. This information is not intended to replace advice given to you by your health care provider. Make sure you discuss any questions you have with your health care provider. Document Released: 10/30/2016 Document Revised: 10/30/2016  Document Reviewed: 10/30/2016 Elsevier Interactive Patient Education  [employer].

## 2018-06-27 NOTE — Progress Notes (Signed)
Subjective:    Patient ID: Mallory Deleon, female    DOB: [birth year], 42 y.o.   MRN: [id number]  Mallory Deleon is a 42 y.o. female presenting on 06/27/2018 for Annual Exam   HPI Annual Physical Exam Patient has been feeling well.  They have no acute concerns today. Reports was bitten by brown recluse at work.  Resolving swelling.  No concerns about this today. Sleeps 7-8 hours per night interrupted x 1 and returns to sleep well.  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   Is avoiding all red meat.  Is eating fish chicken, vegetables/fruits. Fewer sodas. Seatbelt: always Sunscreen: usually PAP: neg 2018, HPV +, Dr. Kenton Kingfisher GYN will screen again Mammogram: Negative about 5 years ago.   HIV: HIV declined.  Negative in past. Optometry: every 1 year Dentistry: Regular  VACCINES: Tetanus: done 03/2017 Influenza: usually declines   Past Medical History:  Diagnosis Date  . Kidney stones 2010  . Thyroid disease    Past Surgical History:  Procedure Laterality Date  . ABDOMINAL HYSTERECTOMY    . LITHOTRIPSY Bilateral   . SPLENECTOMY    . TUBAL LIGATION     Social History   Socioeconomic History  . Marital status: Single    Spouse name: Not on file  . Number of children: 3  . Years of education: Not on file  . Highest education level: Not on file  Occupational History  . Not on file  Social Needs  . Financial resource strain: Not on file  . Food insecurity:    Worry: Not on file    Inability: Not on file  . Transportation needs:    Medical: Not on file    Non-medical: Not on file  Tobacco Use  . Smoking status: Current Every Day Smoker    Packs/day: 0.50    Years: 25.00    Pack years: 12.50    Types: Cigarettes  . Smokeless tobacco: Never Used  . Tobacco comment: smokes when she is bored and stressed  Substance and Sexual Activity  .  Alcohol use: No    Comment: rarely  . Drug use: Yes    Frequency: 6.0 times per week    Types: Marijuana  . Sexual activity: Yes  Lifestyle  . Physical activity:    Days per week: Not on file    Minutes per session: Not on file  . Stress: Not on file  Relationships  . Social connections:    Talks on phone: Not on file    Gets together: Not on file    Attends religious service: Not on file    Active member of club or organization: Not on file    Attends meetings of clubs or organizations: Not on file    Relationship status: Not on file  . Intimate partner violence:    Fear of current or ex partner: Not on file    Emotionally abused: Not on file    Physically abused: Not on file    Forced sexual activity: Not on file  Other Topics Concern  . Not on file  Social History Narrative  . Not on file   Family History  Problem Relation Age of Onset  . Diabetes Mother   . Diabetes Father   . Heart disease Father        CABGx3  . Thyroid disease Sister  hypothyroid  . Hereditary spherocytosis Daughter   . Hereditary spherocytosis Son    Current Outpatient Medications on File Prior to Visit  Medication Sig  . b complex vitamins tablet Take by mouth.  . cefdinir (OMNICEF) 300 MG capsule Take by mouth.  . dicyclomine (BENTYL) 20 MG tablet Take 1 tablet (20 mg total) by mouth 3 (three) times daily as needed for spasms.  . Ergocalciferol (VITAMIN D2) 2000 units TABS Take by mouth.  . folic acid (FOLVITE) 1 MG tablet Take by mouth.  . Lactobacillus (ACIDOPHILUS) CAPS capsule Take by mouth.  . levothyroxine (SYNTHROID, LEVOTHROID) 25 MCG tablet Take 1 tablet (25 mcg total) by mouth daily.  . Magnesium Gluconate 550 MG TABS Take by mouth.  . Potassium 99 MG TABS Take by mouth.  . predniSONE (DELTASONE) 10 MG tablet 6-day taper; 6 pills day one, 5 pills day 2, 4 pills day 3, etc. Follow package instructions.  . vitamin C (ASCORBIC ACID) 500 MG tablet Take by mouth.  .  ondansetron (ZOFRAN ODT) 4 MG disintegrating tablet Take 1 tablet (4 mg total) by mouth every 8 (eight) hours as needed for nausea or vomiting. (Patient not taking: Reported on 06/27/2018)   No current facility-administered medications on file prior to visit.     Review of Systems  Constitutional: Negative for chills and fever.  HENT: Negative for congestion and sore throat.   Eyes: Negative for pain.  Respiratory: Negative for cough, shortness of breath and wheezing.   Cardiovascular: Negative for chest pain, palpitations and leg swelling.  Gastrointestinal: Negative for abdominal pain, blood in stool, constipation, diarrhea, nausea and vomiting.  Endocrine: Negative for polydipsia.  Genitourinary: Negative for dysuria, frequency, hematuria and urgency.  Musculoskeletal: Negative for back pain, myalgias and neck pain.  Skin: Negative.  Negative for rash.  Allergic/Immunologic: Negative for environmental allergies.  Neurological: Negative for dizziness, weakness and headaches.  Hematological: Does not bruise/bleed easily.  Psychiatric/Behavioral: Negative for dysphoric mood and suicidal ideas. The patient is not nervous/anxious.    Per HPI unless specifically indicated above     Objective:    BP 111/69 (BP Location: Left Arm, Patient Position: Sitting, Cuff Size: Normal)   Pulse 80   Temp 98.6 F (37 C) (Oral)   Ht 4\' 11"  (1.499 m)   Wt 176 lb 6.4 oz (80 kg)   BMI 35.63 kg/m   Wt Readings from Last 3 Encounters:  06/27/18 176 lb 6.4 oz (80 kg)  12/24/17 186 lb (84.4 kg)  12/20/17 172 lb (78 kg)    Physical Exam  Constitutional: She is oriented to person, place, and time. She appears well-developed and well-nourished. No distress.  HENT:  Head: Normocephalic and atraumatic.  Right Ear: External ear normal.  Left Ear: External ear normal.  Nose: Nose normal.  Mouth/Throat: Oropharynx is clear and moist.  Eyes: Pupils are equal, round, and reactive to light. Conjunctivae  are normal.  Neck: Normal range of motion. Neck supple. No JVD present. No tracheal deviation present. No thyromegaly present.  Cardiovascular: Normal rate, regular rhythm, normal heart sounds and intact distal pulses. Exam reveals no gallop and no friction rub.  No murmur heard. Pulmonary/Chest: Effort normal and breath sounds normal. No respiratory distress.  Breast - Normal exam w/ symmetric breasts, no mass, no nipple discharge, no skin changes or tenderness.   Abdominal: Soft. Bowel sounds are normal. She exhibits no distension. There is no hepatosplenomegaly. There is no tenderness.  Musculoskeletal: Normal range of motion.  Lymphadenopathy:  She has no cervical adenopathy.  Neurological: She is alert and oriented to person, place, and time. No cranial nerve deficit.  Skin: Skin is warm and dry. Capillary refill takes less than 2 seconds.  Psychiatric: She has a normal mood and affect. Her behavior is normal. Judgment and thought content normal.  Nursing note and vitals reviewed.      Assessment & Plan:   Problem List Items Addressed This Visit    None    Visit Diagnoses    Encounter for annual physical exam    -  Primary   Relevant Orders   MM DIGITAL SCREENING BILATERAL   TSH   Lipid panel   CBC with Differential/Platelet   COMPLETE METABOLIC PANEL WITH GFR   Hemoglobin A1c   Breast cancer screening       Relevant Orders   MM DIGITAL SCREENING BILATERAL   Acquired hypothyroidism       Relevant Orders   TSH   Cigarette nicotine dependence without complication       Relevant Medications   nicotine (NICODERM CQ - DOSED IN MG/24 HOURS) 14 mg/24hr patch (Start on 07/27/2018)   nicotine (NICODERM CQ - DOSED IN MG/24 HR) 7 mg/24hr patch      Physical exam with no new findings.  Well adult with no acute concerns.  Desires monitoring for hypothyroidism today as well.  Plan: 1. Obtain health maintenance screenings as above according to age. - Increase physical activity  to 30 minutes most days of the week.  - Eat healthy diet high in vegetables and fruits; low in refined carbohydrates. - Mammo for breast cancer screening.  Patient desires to begin now with annual screening. 2. Return 1 year for annual physical.   # Encounter for smoking cessation and cigarette nicotine dependence Patient with nicotine dependence and inability to stop smoking cigarettes.  She has tried delaying cigarette, but smokes more after work and the same number of cigarettes per day.  Plan: Discussed methods to stop smoking.  Patient desires to try nicotine patches for replacement.  Instructed patient to set a quit date.  START nicotine patch on quit date.  Change patch daily.  START with 14 mg patch once daily.  After 28 days, reduce t o7 mg nicotine patch once daily for 4 weeks.  Then, stop.  May use 7 mg patch for longer if needed.  Followup 2-3 months. Discussion today >5 minutes (<10 minutes) specifically on counseling on risks of tobacco use, complications, treatment, smoking cessation.     Meds ordered this encounter  Medications  . nicotine (NICODERM CQ - DOSED IN MG/24 HOURS) 14 mg/24hr patch    Sig: Place 1 patch (14 mg total) onto the skin daily.    Dispense:  28 patch    Refill:  0    Order Specific Question:   Supervising Provider    Answer:   Olin Hauser [[id number]]  . nicotine (NICODERM CQ - DOSED IN MG/24 HR) 7 mg/24hr patch    Sig: Place 1 patch (7 mg total) onto the skin daily.    Dispense:  28 patch    Refill:  0    Order Specific Question:   Supervising Provider    Answer:   Olin Hauser [[id number]]     Follow up plan: Return in about 1 year (around 06/28/2019) for annual physical AND 6 months for thyroid.  Cassell Smiles, DNP, AGPCNP-BC Adult Gerontology Primary Care Nurse Practitioner [facility]  06/27/2018, 4:54 PM

## 2018-06-29 DIAGNOSIS — E782 Mixed hyperlipidemia: Secondary | ICD-10-CM | POA: Diagnosis not present

## 2018-06-29 DIAGNOSIS — E039 Hypothyroidism, unspecified: Secondary | ICD-10-CM | POA: Diagnosis not present

## 2018-06-29 DIAGNOSIS — Z131 Encounter for screening for diabetes mellitus: Secondary | ICD-10-CM | POA: Diagnosis not present

## 2018-06-29 DIAGNOSIS — Z0000 Encounter for general adult medical examination without abnormal findings: Secondary | ICD-10-CM | POA: Diagnosis not present

## 2018-06-30 ENCOUNTER — Encounter: Payer: Self-pay | Admitting: Nurse Practitioner

## 2018-06-30 DIAGNOSIS — Z87442 Personal history of urinary calculi: Secondary | ICD-10-CM | POA: Insufficient documentation

## 2018-06-30 DIAGNOSIS — E039 Hypothyroidism, unspecified: Secondary | ICD-10-CM | POA: Insufficient documentation

## 2018-06-30 LAB — CBC WITH DIFFERENTIAL/PLATELET
Basophils Absolute: 105 cells/uL (ref 0–200)
Basophils Relative: 0.6 %
Eosinophils Absolute: 315 cells/uL (ref 15–500)
Eosinophils Relative: 1.8 %
HCT: 42 % (ref 35.0–45.0)
Hemoglobin: 14.2 g/dL (ref 11.7–15.5)
Lymphs Abs: 5093 cells/uL — ABNORMAL HIGH (ref 850–3900)
MCH: 30.2 pg (ref 27.0–33.0)
MCHC: 33.8 g/dL (ref 32.0–36.0)
MCV: 89.4 fL (ref 80.0–100.0)
MPV: 10.1 fL (ref 7.5–12.5)
Monocytes Relative: 7.5 %
Neutro Abs: 10675 cells/uL — ABNORMAL HIGH (ref 1500–7800)
Neutrophils Relative %: 61 %
Platelets: 578 10*3/uL — ABNORMAL HIGH (ref 140–400)
RBC: 4.7 10*6/uL (ref 3.80–5.10)
RDW: 13.7 % (ref 11.0–15.0)
Total Lymphocyte: 29.1 %
WBC mixed population: 1313 cells/uL — ABNORMAL HIGH (ref 200–950)
WBC: 17.5 10*3/uL — ABNORMAL HIGH (ref 3.8–10.8)

## 2018-06-30 LAB — HEMOGLOBIN A1C
Hgb A1c MFr Bld: 5.3 % of total Hgb (ref <5.7)
Mean Plasma Glucose: 105 (calc)
eAG (mmol/L): 5.8 (calc)

## 2018-06-30 LAB — COMPLETE METABOLIC PANEL WITH GFR
AG Ratio: 1.8 (calc) (ref 1.0–2.5)
ALT: 12 U/L (ref 6–29)
AST: 13 U/L (ref 10–30)
Albumin: 4.3 g/dL (ref 3.6–5.1)
Alkaline phosphatase (APISO): 73 U/L (ref 33–115)
BUN/Creatinine Ratio: 35 (calc) — ABNORMAL HIGH (ref 6–22)
BUN: 16 mg/dL (ref 7–25)
CO2: 27 mmol/L (ref 20–32)
Calcium: 9.2 mg/dL (ref 8.6–10.2)
Chloride: 105 mmol/L (ref 98–110)
Creat: 0.46 mg/dL — ABNORMAL LOW (ref 0.50–1.10)
GFR, Est African American: 142 mL/min/{1.73_m2} (ref >=60)
GFR, Est Non African American: 123 mL/min/{1.73_m2} (ref >=60)
Globulin: 2.4 g/dL (calc) (ref 1.9–3.7)
Glucose, Bld: 101 mg/dL — ABNORMAL HIGH (ref 65–99)
Potassium: 4.3 mmol/L (ref 3.5–5.3)
Sodium: 140 mmol/L (ref 135–146)
Total Bilirubin: 0.7 mg/dL (ref 0.2–1.2)
Total Protein: 6.7 g/dL (ref 6.1–8.1)

## 2018-06-30 LAB — TSH: TSH: 1.85 mIU/L

## 2018-06-30 LAB — LIPID PANEL
Cholesterol: 199 mg/dL (ref <200)
HDL: 55 mg/dL (ref >50)
LDL Cholesterol (Calc): 127 mg/dL (calc) — ABNORMAL HIGH
Non-HDL Cholesterol (Calc): 144 mg/dL (calc) — ABNORMAL HIGH (ref <130)
Total CHOL/HDL Ratio: 3.6 (calc) (ref <5.0)
Triglycerides: 76 mg/dL (ref <150)

## 2018-07-01 ENCOUNTER — Encounter: Payer: Self-pay | Admitting: Nurse Practitioner

## 2018-07-04 ENCOUNTER — Other Ambulatory Visit: Payer: Self-pay

## 2018-07-04 MED ORDER — LEVOTHYROXINE SODIUM 25 MCG PO TABS: 25.0000 ug | ORAL_TABLET | Freq: Every day | ORAL | 5 refills | Status: DC

## 2018-09-01 ENCOUNTER — Ambulatory Visit (INDEPENDENT_AMBULATORY_CARE_PROVIDER_SITE_OTHER): Payer: BLUE CROSS/BLUE SHIELD | Admitting: Nurse Practitioner

## 2018-09-01 ENCOUNTER — Encounter: Payer: Self-pay | Admitting: Nurse Practitioner

## 2018-09-01 VITALS — BP 121/74 | HR 74 | Temp 98.6°F | Resp 18 | Ht 59.0 in | Wt 176.6 lb

## 2018-09-01 DIAGNOSIS — J00 Acute nasopharyngitis [common cold]: Secondary | ICD-10-CM

## 2018-09-01 MED ORDER — FLUTICASONE PROPIONATE 50 MCG/ACT NA SUSP: 2.0000 | Freq: Every day | NASAL | 6 refills | Status: DC

## 2018-09-01 MED ORDER — IPRATROPIUM BROMIDE 0.06 % NA SOLN: 2.0000 | Freq: Four times a day (QID) | NASAL | 0 refills | Status: DC

## 2018-09-01 NOTE — Progress Notes (Signed)
Subjective:    Patient ID: Mallory Deleon, female    DOB: [birth year], 42 y.o.   MRN: [id number]  Mallory Deleon is a 42 y.o. female presenting on 09/01/2018 for Sore Throat (sneezing, coughing, runny nose, headache and ear pain x 4 days )   HPI URI symptoms Patient presents today with uri symptoms for last 4 days.  Symptoms include body aches, headache, ear pain, fatigue, malaise, upset stomach.  Sore throat.  Some cough. - no known fever, has had chills/sweats - sick contacts grandkids with cold.   - Has not taken anything OTC other than tylenol and ibuprofen for symptoms.  Social History   Tobacco Use  . Smoking status: Current Every Day Smoker    Packs/day: 0.50    Years: 25.00    Pack years: 12.50    Types: Cigarettes  . Smokeless tobacco: Never Used  . Tobacco comment: smokes when she is bored and stressed  Substance Use Topics  . Alcohol use: No    Comment: rarely  . Drug use: Yes    Frequency: 6.0 times per week    Types: Marijuana    Review of Systems Per HPI unless specifically indicated above     Objective:    BP 121/74   Pulse 74   Temp 98.6 F (37 C) (Oral)   Resp 18   Ht 4\' 11"  (1.499 m)   Wt 176 lb 9.6 oz (80.1 kg)   SpO2 99%   BMI 35.67 kg/m   Wt Readings from Last 3 Encounters:  09/01/18 176 lb 9.6 oz (80.1 kg)  06/27/18 176 lb 6.4 oz (80 kg)  12/24/17 186 lb (84.4 kg)    Physical Exam  Constitutional: She is oriented to person, place, and time. She appears well-developed and well-nourished. No distress.  HENT:  Head: Normocephalic and atraumatic.  Right Ear: Hearing, tympanic membrane, external ear and ear canal normal. No drainage, swelling or tenderness.  Left Ear: Hearing, tympanic membrane, external ear and ear canal normal. No drainage or swelling.  Nose: Mucosal edema and rhinorrhea present. Right sinus exhibits maxillary sinus tenderness and frontal sinus tenderness. Left sinus exhibits maxillary sinus tenderness and frontal sinus  tenderness.  Mouth/Throat: Uvula is midline and mucous membranes are normal. No oral lesions. No uvula swelling. Oropharyngeal exudate (clear secretions) and posterior oropharyngeal edema (cobblestoning) present. No posterior oropharyngeal erythema or tonsillar abscesses. Tonsils are 0 on the right. Tonsils are 0 on the left. No tonsillar exudate.  Eyes: Pupils are equal, round, and reactive to light. Conjunctivae and EOM are normal. Right eye exhibits no discharge. Left eye exhibits no discharge.  Neck: Normal range of motion. Neck supple.  Cardiovascular: Normal rate, regular rhythm, S1 normal, S2 normal, normal heart sounds and intact distal pulses.  Pulmonary/Chest: Effort normal and breath sounds normal. No respiratory distress.  Lymphadenopathy:    She has cervical adenopathy.  Neurological: She is alert and oriented to person, place, and time.  Skin: Skin is warm and dry. Capillary refill takes less than 2 seconds.  Psychiatric: She has a normal mood and affect. Her behavior is normal.  Vitals reviewed.  Results for orders placed or performed in visit on 06/27/18  TSH  Result Value Ref Range   TSH 1.85 mIU/L  Lipid panel  Result Value Ref Range   Cholesterol 199 <200 mg/dL   HDL 55 >50 mg/dL   Triglycerides 76 <150 mg/dL   LDL Cholesterol (Calc) 127 (H) mg/dL (calc)   Total CHOL/HDL  Ratio 3.6 <5.0 (calc)   Non-HDL Cholesterol (Calc) 144 (H) <130 mg/dL (calc)  CBC with Differential/Platelet  Result Value Ref Range   WBC 17.5 (H) 3.8 - 10.8 Thousand/uL   RBC 4.70 3.80 - 5.10 Million/uL   Hemoglobin 14.2 11.7 - 15.5 g/dL   HCT 42.0 35.0 - 45.0 %   MCV 89.4 80.0 - 100.0 fL   MCH 30.2 27.0 - 33.0 pg   MCHC 33.8 32.0 - 36.0 g/dL   RDW 13.7 11.0 - 15.0 %   Platelets 578 (H) 140 - 400 Thousand/uL   MPV 10.1 7.5 - 12.5 fL   Neutro Abs 10,675 (H) 1,500 - 7,800 cells/uL   Lymphs Abs 5,093 (H) 850 - 3,900 cells/uL   WBC mixed population 1,313 (H) 200 - 950 cells/uL   Eosinophils  Absolute 315 15 - 500 cells/uL   Basophils Absolute 105 0 - 200 cells/uL   Neutrophils Relative % 61 %   Total Lymphocyte 29.1 %   Monocytes Relative 7.5 %   Eosinophils Relative 1.8 %   Basophils Relative 0.6 %  COMPLETE METABOLIC PANEL WITH GFR  Result Value Ref Range   Glucose, Bld 101 (H) 65 - 99 mg/dL   BUN 16 7 - 25 mg/dL   Creat 0.46 (L) 0.50 - 1.10 mg/dL   GFR, Est Non African American 123 > OR = 60 mL/min/1.65m2   GFR, Est African American 142 > OR = 60 mL/min/1.52m2   BUN/Creatinine Ratio 35 (H) 6 - 22 (calc)   Sodium 140 135 - 146 mmol/L   Potassium 4.3 3.5 - 5.3 mmol/L   Chloride 105 98 - 110 mmol/L   CO2 27 20 - 32 mmol/L   Calcium 9.2 8.6 - 10.2 mg/dL   Total Protein 6.7 6.1 - 8.1 g/dL   Albumin 4.3 3.6 - 5.1 g/dL   Globulin 2.4 1.9 - 3.7 g/dL (calc)   AG Ratio 1.8 1.0 - 2.5 (calc)   Total Bilirubin 0.7 0.2 - 1.2 mg/dL   Alkaline phosphatase (APISO) 73 33 - 115 U/L   AST 13 10 - 30 U/L   ALT 12 6 - 29 U/L  Hemoglobin A1c  Result Value Ref Range   Hgb A1c MFr Bld 5.3 <5.7 % of total Hgb   Mean Plasma Glucose 105 (calc)   eAG (mmol/L) 5.8 (calc)      Assessment & Plan:   Problem List Items Addressed This Visit    None    Visit Diagnoses    Acute nasopharyngitis    -  Primary   Relevant Medications   ipratropium (ATROVENT) 0.06 % nasal spray   fluticasone (FLONASE) 50 MCG/ACT nasal spray     Acute illness. Fever responsive to NSAIDs and tylenol.  Symptoms not worsening. Consistent with viral illness x 3-4 days with known sick contacts and no identifiable focal infections of ears, nose, throat.  Plan: 1. Reassurance, likely self-limited with cough lasting up to few weeks - Start Atrovent nasal spray decongestant 2 sprays each nostril up to 4 times daily for 5-7 days - Start anti-histamine Loratadine or Cetirizine 10mg  daily, May continue Benadryl if preferred - also can use Flonase 2 sprays each nostril daily for up to 4-6 weeks - Start Mucinex-DM OTC  up to 7-10 days then stop 2. Supportive care with nasal saline, warm herbal tea with honey, 3. Improve hydration 4. Tylenol / Motrin PRN fevers 5. Return criteria given  - Consider amoxicillin or augmentin on Monday-Wed (10/21-10/23) next week if  symptoms are not improved   Meds ordered this encounter  Medications  . ipratropium (ATROVENT) 0.06 % nasal spray    Sig: Place 2 sprays into both nostrils 4 (four) times daily for 5 days.    Dispense:  15 mL    Refill:  0    Order Specific Question:   Supervising Provider    Answer:   Olin Hauser [[id number]]  . fluticasone (FLONASE) 50 MCG/ACT nasal spray    Sig: Place 2 sprays into both nostrils daily.    Dispense:  16 g    Refill:  6    Order Specific Question:   Supervising Provider    Answer:   Olin Hauser [[id number]]    Follow up plan: Return 5-7 days if symptoms worsen or fail to improve.  Cassell Smiles, DNP, AGPCNP-BC Adult Gerontology Primary Care Nurse Practitioner [facility] 09/01/2018, 10:15 AM

## 2018-09-01 NOTE — Patient Instructions (Addendum)
Adelina Mings,   Thank you for coming in to clinic today.  1. It sounds like you have a Upper Respiratory Virus - this will most likely run it's course in 7 to 10 days. Recommend good hand washing. - Start Atrovent nasal spray decongestant 2 sprays each nostril up to 4 times daily for 5-7 days - Start anti-histamine cetirizine 10mg  daily or benadryl 1-2 times daily if needed,  - also can use Flonase 2 sprays each nostril daily for up to 4-6 weeks - If congestion is worse, start OTC Mucinex (or may try Mucinex-DM for cough) up to 7-10 days then stop - Drink plenty of fluids to improve congestion - You may try over the counter Nasal Saline spray (Simply Saline, Ocean Spray) as needed to reduce congestion. - Drink warm herbal tea with honey for sore throat. - Start taking Tylenol extra strength 1 to 2 tablets every 6-8 hours for aches or fever/chills for next few days as needed.  Do not take more than 3,000 mg in 24 hours from all medicines.  May take Ibuprofen as well if tolerated 200-400mg  every 8 hours as needed.  If symptoms significantly worsening with persistent fevers/chills despite tylenol/ibpurofen, nausea, vomiting unable to tolerate food/fluids or medicine, body aches, or shortness of breath, sinus pain pressure or worsening productive cough, then follow-up for re-evaluation, may seek more immediate care at Urgent Care or ED if more concerned for emergency.  Please schedule a follow-up appointment with Cassell Smiles, AGNP. Return 5-7 days if symptoms worsen or fail to improve.  We will consider antibiotics if not improving by about 10 days.  If you have any other questions or concerns, please feel free to call the clinic or send a message through [place]. You may also schedule an earlier appointment if necessary.  You will receive a survey after today's visit either digitally by e-mail or paper by [employer]. Your experiences and feedback matter to [place].  Please respond so we know how we  are doing as we provide care for you.   Cassell Smiles, DNP, AGNP-BC Adult Gerontology Nurse Practitioner [place]

## 2018-09-06 ENCOUNTER — Encounter: Payer: Self-pay | Admitting: Nurse Practitioner

## 2018-10-24 ENCOUNTER — Ambulatory Visit (INDEPENDENT_AMBULATORY_CARE_PROVIDER_SITE_OTHER): Payer: BLUE CROSS/BLUE SHIELD | Admitting: Nurse Practitioner

## 2018-10-24 ENCOUNTER — Encounter: Payer: Self-pay | Admitting: Nurse Practitioner

## 2018-10-24 ENCOUNTER — Other Ambulatory Visit: Payer: Self-pay

## 2018-10-24 VITALS — BP 120/99 | HR 96 | Temp 98.8°F | Ht 59.0 in | Wt 179.0 lb

## 2018-10-24 DIAGNOSIS — J020 Streptococcal pharyngitis: Secondary | ICD-10-CM | POA: Diagnosis not present

## 2018-10-24 DIAGNOSIS — R509 Fever, unspecified: Secondary | ICD-10-CM

## 2018-10-24 LAB — POCT RAPID STREP A (OFFICE): Rapid Strep A Screen: POSITIVE — AB

## 2018-10-24 LAB — POCT INFLUENZA A/B
Influenza A, POC: NEGATIVE
Influenza B, POC: NEGATIVE

## 2018-10-24 MED ORDER — AMOXICILLIN-POT CLAVULANATE 875-125 MG PO TABS: 1.0000 | ORAL_TABLET | Freq: Two times a day (BID) | ORAL | 0 refills | Status: AC

## 2018-10-24 NOTE — Progress Notes (Signed)
Subjective:    Patient ID: Mallory Deleon, female    DOB: 11-26-1975, 42 y.o.   MRN: [id number]  Mallory Deleon is a 42 y.o. female presenting on 10/24/2018 for Sore Throat (headache, bodyaches, neck pain, chills  and fever  x 1week. Pt took ibuprofen x 45 minutes ago )   HPI Sore throat Patient presents today for sore throat with headache, body aches, chills, fever for last 6 days.  This is also associated with mild neck pain.  Denies neck stiffness.  Patient has mild cough.  She also has a son who has similar symptoms.  Patient believes that she has sore throat. - Patient denies nausea, vomiting, diarrhea.  Social History   Tobacco Use  . Smoking status: Current Every Day Smoker    Packs/day: 0.50    Years: 25.00    Pack years: 12.50    Types: Cigarettes  . Smokeless tobacco: Never Used  . Tobacco comment: smokes when she is bored and stressed  Substance Use Topics  . Alcohol use: No    Comment: rarely  . Drug use: Yes    Frequency: 6.0 times per week    Types: Marijuana    Review of Systems Per HPI unless specifically indicated above     Objective:    BP (!) 120/99 (BP Location: Right Arm, Patient Position: Sitting, Cuff Size: Normal)   Pulse 96   Temp 98.8 F (37.1 C) (Oral)   Ht 4\' 11"  (1.499 m)   Wt 179 lb (81.2 kg)   BMI 36.15 kg/m    Wt Readings from Last 3 Encounters:  10/24/18 179 lb (81.2 kg)  09/01/18 176 lb 9.6 oz (80.1 kg)  06/27/18 176 lb 6.4 oz (80 kg)    Physical Exam  Constitutional: She appears well-developed and well-nourished.  HENT:  Head: Normocephalic and atraumatic.  Right Ear: Hearing, tympanic membrane, external ear and ear canal normal. No drainage, swelling or tenderness. No middle ear effusion.  Left Ear: Hearing, tympanic membrane, external ear and ear canal normal. No drainage, swelling or tenderness.  No middle ear effusion.  Nose: Mucosal edema and rhinorrhea present. Right sinus exhibits maxillary sinus tenderness and frontal  sinus tenderness. Left sinus exhibits maxillary sinus tenderness and frontal sinus tenderness.  Mouth/Throat: Uvula is midline and mucous membranes are normal. Oropharyngeal exudate, posterior oropharyngeal edema and posterior oropharyngeal erythema present. No tonsillar abscesses. Tonsils are 1+ on the right. Tonsils are 1+ on the left.  Eyes: Pupils are equal, round, and reactive to light. Conjunctivae and EOM are normal. Right eye exhibits no discharge. Left eye exhibits no discharge.  Neck: Normal range of motion. Neck supple.  Cardiovascular: Normal rate, regular rhythm, S1 normal, S2 normal, normal heart sounds and intact distal pulses.  Pulmonary/Chest: Effort normal and breath sounds normal. No respiratory distress.  Lymphadenopathy:    She has cervical adenopathy.  Neurological: She is alert.  Skin: Skin is warm and dry. Capillary refill takes less than 2 seconds.  Psychiatric: She has a normal mood and affect. Her behavior is normal.  Vitals reviewed.    Results for orders placed or performed in visit on 10/24/18  POCT rapid strep A  Result Value Ref Range   Rapid Strep A Screen Positive (A) Negative  POCT Influenza A/B  Result Value Ref Range   Influenza A, POC Negative Negative   Influenza B, POC Negative Negative      Assessment & Plan:   Problem List Items Addressed This Visit  None    Visit Diagnoses    Fever and chills    -  Primary   Relevant Medications   amoxicillin-clavulanate (AUGMENTIN) 875-125 MG tablet   Other Relevant Orders   POCT rapid strep A (Completed)   POCT Influenza A/B (Completed)   Strep throat       Relevant Medications   amoxicillin-clavulanate (AUGMENTIN) 875-125 MG tablet    RAPID STREP POSITIVE Suspected acute strep throat infection based on history and exam. No known strep or sick contacts, but now has likely passed this to her son. No other localized infection, TMs clear. Able to tolerate PO. Centor Score 3.  Plan: 1. Rapid strep  Positive today 2. Start antibiotics with Augmentin 875-125mg  BID x 10 days 3. Symptomatic control with NSAID / Tylenol regularly then PRN 4. Improve hydration, advance diet, warm herbal tea with honey 5. Follow-up within 1 week if worsening    Meds ordered this encounter  Medications  . amoxicillin-clavulanate (AUGMENTIN) 875-125 MG tablet    Sig: Take 1 tablet by mouth 2 (two) times daily for 10 days.    Dispense:  20 tablet    Refill:  0    Order Specific Question:   Supervising Provider    Answer:   Olin Hauser [[id number]]    Follow up plan: Return if symptoms worsen or fail to improve.  Cassell Smiles, DNP, AGPCNP-BC Adult Gerontology Primary Care Nurse Practitioner [facility] 10/24/2018, 9:06 AM

## 2018-10-24 NOTE — Patient Instructions (Signed)
Mallory Deleon,   Thank you for coming in to clinic today.  1. START Augmentin 875-125 mg one tablet every 12 hours for 10 days.   Please schedule a follow-up appointment with Cassell Smiles, AGNP. No follow-ups on file.  If you have any other questions or concerns, please feel free to call the clinic or send a message through [place]. You may also schedule an earlier appointment if necessary.  You will receive a survey after today's visit either digitally by e-mail or paper by [employer]. Your experiences and feedback matter to [place].  Please respond so we know how we are doing as we provide care for you.   Cassell Smiles, DNP, AGNP-BC Adult Gerontology Nurse Practitioner [facility], [facility]   Strep Throat Strep throat is a bacterial infection of the throat. Your health care provider may call the infection tonsillitis or pharyngitis, depending on whether there is swelling in the tonsils or at the back of the throat. Strep throat is most common during the cold months of the year in children who are 76-76 years of age, but it can happen during any season in people of any age. This infection is spread from person to person (contagious) through coughing, sneezing, or close contact. What are the causes? Strep throat is caused by the bacteria called Streptococcus pyogenes. What increases the risk? This condition is more likely to develop in:  People who spend time in crowded places where the infection can spread easily.  People who have close contact with someone who has strep throat.  What are the signs or symptoms? Symptoms of this condition include:  Fever or chills.  Redness, swelling, or pain in the tonsils or throat.  Pain or difficulty when swallowing.  White or yellow spots on the tonsils or throat.  Swollen, tender glands in the neck or under the jaw.  Red rash all over the body (rare).  How is this diagnosed? This condition is diagnosed by performing a rapid  strep test or by taking a swab of your throat (throat culture test). Results from a rapid strep test are usually ready in a few minutes, but throat culture test results are available after one or two days. How is this treated? This condition is treated with antibiotic medicine. Follow these instructions at home: Medicines  Take over-the-counter and prescription medicines only as told by your health care provider.  Take your antibiotic as told by your health care provider. Do not stop taking the antibiotic even if you start to feel better.  Have family members who also have a sore throat or fever tested for strep throat. They may need antibiotics if they have the strep infection. Eating and drinking  Do not share food, drinking cups, or personal items that could cause the infection to spread to other people.  If swallowing is difficult, try eating soft foods until your sore throat feels better.  Drink enough fluid to keep your urine clear or pale yellow. General instructions  Gargle with a salt-water mixture 3-4 times per day or as needed. To make a salt-water mixture, completely dissolve -1 tsp of salt in 1 cup of warm water.  Make sure that all household members wash their hands well.  Get plenty of rest.  Stay home from school or work until you have been taking antibiotics for 24 hours.  Keep all follow-up visits as told by your health care provider. This is important. Contact a health care provider if:  The glands  in your neck continue to get bigger.  You develop a rash, cough, or earache.  You cough up a thick liquid that is green, yellow-brown, or bloody.  You have pain or discomfort that does not get better with medicine.  Your problems seem to be getting worse rather than better.  You have a fever. Get help right away if:  You have new symptoms, such as vomiting, severe headache, stiff or painful neck, chest pain, or shortness of breath.  You have severe throat  pain, drooling, or changes in your voice.  You have swelling of the neck, or the skin on the neck becomes red and tender.  You have signs of dehydration, such as fatigue, dry mouth, and decreased urination.  You become increasingly sleepy, or you cannot wake up completely.  Your joints become red or painful. This information is not intended to replace advice given to you by your health care provider. Make sure you discuss any questions you have with your health care provider. Document Released: 10/30/2000 Document Revised: 07/01/2016 Document Reviewed: 02/25/2015 Elsevier Interactive Patient Education  [employer].

## 2019-02-01 DIAGNOSIS — R6889 Other general symptoms and signs: Secondary | ICD-10-CM | POA: Diagnosis not present

## 2019-02-01 DIAGNOSIS — J019 Acute sinusitis, unspecified: Secondary | ICD-10-CM | POA: Diagnosis not present

## 2019-02-07 ENCOUNTER — Encounter: Payer: Self-pay | Admitting: Nurse Practitioner

## 2019-02-07 ENCOUNTER — Telehealth: Payer: Self-pay | Admitting: Nurse Practitioner

## 2019-02-07 MED ORDER — LEVOTHYROXINE SODIUM 25 MCG PO TABS: 25.0000 ug | ORAL_TABLET | Freq: Every day | ORAL | 1 refills | Status: DC

## 2019-02-07 NOTE — Telephone Encounter (Signed)
Prescription sent to the pt pharmacy. I attempted to notify the pt, but her mailbox was full.

## 2019-02-07 NOTE — Telephone Encounter (Signed)
Pt called requesting refill on levothyroxine  Called into [facility]

## 2019-03-31 DIAGNOSIS — M7731 Calcaneal spur, right foot: Secondary | ICD-10-CM | POA: Diagnosis not present

## 2019-03-31 DIAGNOSIS — M79671 Pain in right foot: Secondary | ICD-10-CM | POA: Diagnosis not present

## 2019-03-31 DIAGNOSIS — M722 Plantar fascial fibromatosis: Secondary | ICD-10-CM | POA: Diagnosis not present

## 2019-04-10 ENCOUNTER — Encounter: Payer: Self-pay | Admitting: Nurse Practitioner

## 2019-04-11 MED ORDER — LEVOTHYROXINE SODIUM 25 MCG PO TABS: 25.0000 ug | ORAL_TABLET | Freq: Every day | ORAL | 1 refills | Status: DC

## 2019-04-19 ENCOUNTER — Other Ambulatory Visit: Payer: Self-pay

## 2019-04-19 ENCOUNTER — Ambulatory Visit: Payer: BC Managed Care – PPO | Admitting: Family Medicine

## 2019-04-19 DIAGNOSIS — R509 Fever, unspecified: Secondary | ICD-10-CM

## 2019-04-19 DIAGNOSIS — R079 Chest pain, unspecified: Secondary | ICD-10-CM | POA: Diagnosis not present

## 2019-04-19 DIAGNOSIS — R51 Headache: Secondary | ICD-10-CM | POA: Diagnosis not present

## 2019-04-19 DIAGNOSIS — R05 Cough: Secondary | ICD-10-CM | POA: Diagnosis not present

## 2019-04-19 DIAGNOSIS — R197 Diarrhea, unspecified: Secondary | ICD-10-CM | POA: Diagnosis not present

## 2019-04-19 DIAGNOSIS — R6889 Other general symptoms and signs: Secondary | ICD-10-CM | POA: Diagnosis not present

## 2019-04-19 NOTE — Progress Notes (Signed)
Patient was scheduled for 4:00pm appointment virtual visit by phone. She did not pick up phone and was not able to be reached at the time of her appointment. Chart review shows that she was seen at [facility] today.  This encountered was closed and apt was cancelled. No medical care or treatment provided to her today by our office.  Nobie Putnam, DO [facility] 04/19/2019, 4:33 PM

## 2019-06-19 ENCOUNTER — Encounter: Payer: Self-pay | Admitting: Nurse Practitioner

## 2019-06-19 DIAGNOSIS — E039 Hypothyroidism, unspecified: Secondary | ICD-10-CM

## 2019-06-19 MED ORDER — LEVOTHYROXINE SODIUM 25 MCG PO TABS: 25.0000 ug | ORAL_TABLET | Freq: Every day | ORAL | 0 refills | Status: DC

## 2019-06-21 DIAGNOSIS — E039 Hypothyroidism, unspecified: Secondary | ICD-10-CM | POA: Diagnosis not present

## 2019-06-22 LAB — TSH+FREE T4: TSH W/REFLEX TO FT4: 1.23 mIU/L

## 2019-07-19 ENCOUNTER — Encounter: Payer: Self-pay | Admitting: Nurse Practitioner

## 2019-07-19 DIAGNOSIS — E039 Hypothyroidism, unspecified: Secondary | ICD-10-CM

## 2019-07-20 MED ORDER — LEVOTHYROXINE SODIUM 25 MCG PO TABS: 25.0000 ug | ORAL_TABLET | Freq: Every day | ORAL | 1 refills | Status: DC

## 2019-08-23 ENCOUNTER — Other Ambulatory Visit: Payer: Self-pay

## 2019-08-23 DIAGNOSIS — Z20822 Contact with and (suspected) exposure to covid-19: Secondary | ICD-10-CM

## 2019-08-25 LAB — NOVEL CORONAVIRUS, NAA: SARS-CoV-2, NAA: NOT DETECTED

## 2019-09-16 ENCOUNTER — Encounter: Payer: Self-pay | Admitting: Nurse Practitioner

## 2019-11-24 ENCOUNTER — Ambulatory Visit: Payer: Self-pay | Attending: Internal Medicine

## 2019-11-24 DIAGNOSIS — Z20822 Contact with and (suspected) exposure to covid-19: Secondary | ICD-10-CM | POA: Insufficient documentation

## 2019-11-26 LAB — NOVEL CORONAVIRUS, NAA: SARS-CoV-2, NAA: NOT DETECTED

## 2019-12-01 ENCOUNTER — Ambulatory Visit: Payer: Self-pay | Attending: Internal Medicine

## 2019-12-01 DIAGNOSIS — Z20822 Contact with and (suspected) exposure to covid-19: Secondary | ICD-10-CM | POA: Insufficient documentation

## 2019-12-02 LAB — NOVEL CORONAVIRUS, NAA: SARS-CoV-2, NAA: NOT DETECTED

## 2019-12-04 ENCOUNTER — Emergency Department
Admission: EM | Admit: 2019-12-04 | Discharge: 2019-12-04 | Disposition: A | Discharge status: Home / Self Care | Payer: Self-pay | Attending: Emergency Medicine | Admitting: Emergency Medicine

## 2019-12-04 ENCOUNTER — Other Ambulatory Visit: Payer: Self-pay

## 2019-12-04 ENCOUNTER — Emergency Department: Payer: Self-pay

## 2019-12-04 ENCOUNTER — Encounter: Payer: Self-pay | Admitting: Emergency Medicine

## 2019-12-04 DIAGNOSIS — F17210 Nicotine dependence, cigarettes, uncomplicated: Secondary | ICD-10-CM | POA: Insufficient documentation

## 2019-12-04 DIAGNOSIS — Z79899 Other long term (current) drug therapy: Secondary | ICD-10-CM | POA: Insufficient documentation

## 2019-12-04 DIAGNOSIS — E039 Hypothyroidism, unspecified: Secondary | ICD-10-CM | POA: Insufficient documentation

## 2019-12-04 DIAGNOSIS — R079 Chest pain, unspecified: Secondary | ICD-10-CM | POA: Insufficient documentation

## 2019-12-04 DIAGNOSIS — R0602 Shortness of breath: Secondary | ICD-10-CM | POA: Insufficient documentation

## 2019-12-04 LAB — TROPONIN I (HIGH SENSITIVITY): Troponin I (High Sensitivity): <2 ng/L (ref <18)

## 2019-12-04 LAB — CBC
HCT: 37.1 % (ref 36.0–46.0)
Hemoglobin: 13 g/dL (ref 12.0–15.0)
MCH: 29.7 pg (ref 26.0–34.0)
MCHC: 35 g/dL (ref 30.0–36.0)
MCV: 84.9 fL (ref 80.0–100.0)
Platelets: 629 10*3/uL — ABNORMAL HIGH (ref 150–400)
RBC: 4.37 MIL/uL (ref 3.87–5.11)
RDW: 13.1 % (ref 11.5–15.5)
WBC: 13.6 10*3/uL — ABNORMAL HIGH (ref 4.0–10.5)
nRBC: 0 % (ref 0.0–0.2)

## 2019-12-04 LAB — BASIC METABOLIC PANEL
Anion gap: 9 (ref 5–15)
BUN: 8 mg/dL (ref 6–20)
CO2: 25 mmol/L (ref 22–32)
Calcium: 9 mg/dL (ref 8.9–10.3)
Chloride: 105 mmol/L (ref 98–111)
Creatinine, Ser: 0.45 mg/dL (ref 0.44–1.00)
GFR calc Af Amer: >60 mL/min (ref >60)
GFR calc non Af Amer: >60 mL/min (ref >60)
Glucose, Bld: 152 mg/dL — ABNORMAL HIGH (ref 70–99)
Potassium: 3.6 mmol/L (ref 3.5–5.1)
Sodium: 139 mmol/L (ref 135–145)

## 2019-12-04 MED ORDER — SODIUM CHLORIDE 0.9% FLUSH: 3.0000 mL | Freq: Once | INTRAVENOUS | Status: DC

## 2019-12-04 NOTE — ED Triage Notes (Signed)
Chest pain x 2 weeks. States did have COVID exposure however has had 2 negative tests nasal swab which were negative. Has headache and body aches but no fever.

## 2019-12-04 NOTE — ED Notes (Signed)
Pt is stable and in no distress. Pt reports chest pain and body aches but 2 negative covid tests. O2 sats are 99%.

## 2019-12-04 NOTE — Discharge Instructions (Addendum)
As we dsicussed you can purchase a portable pulse oximeter to measure your oxygen levels. IF it falls below 90 please be seen again. Please seek medical attention for any high fevers, chest pain, shortness of breath, change in behavior, persistent vomiting, bloody stool or any other new or concerning symptoms.

## 2019-12-04 NOTE — ED Provider Notes (Signed)
[facility] Emergency Department Provider Note   ____________________________________________   I have reviewed the triage vital signs and the nursing notes.   HISTORY  Chief Complaint Chest Pain and COVID exposure   History limited by: Not Limited   HPI Mallory Deleon is a 44 y.o. female who presents to the emergency department today because of concern for chest pain and shortness of breath. Symptoms have been present for roughly 9 days. Feeling of pressure in her central chest. Shortness of breath is worse with exertion. Has had a cough productive of brown sputum. The patient denies any fevers. Did have a covid exposure prior to the symptoms starting. She states she has had two covid tests since the symptoms started and that both were negative. The patient denies any fevers.    Records reviewed. Per medical record review patient has a history of hypothyroidism, splenectomy.   Past Medical History:  Diagnosis Date  . Kidney stones 2010  . Thyroid disease     Patient Active Problem List   Diagnosis Date Noted  . Hypothyroidism 06/30/2018  . History of kidney stones 06/30/2018  . Diarrhea 06/30/2016    Past Surgical History:  Procedure Laterality Date  . ABDOMINAL HYSTERECTOMY    . LITHOTRIPSY Bilateral   . SPLENECTOMY    . TUBAL LIGATION      Prior to Admission medications   Medication Sig Start Date End Date Taking? Authorizing Provider  b complex vitamins tablet Take by mouth.    [provider]  dicyclomine (BENTYL) 20 MG tablet Take 1 tablet (20 mg total) by mouth 3 (three) times daily as needed for spasms. 12/20/17 12/20/18  Darel Hong, MD  Ergocalciferol (VITAMIN D2) 2000 units TABS Take by mouth.    [provider]  fluticasone (FLONASE) 50 MCG/ACT nasal spray Place 2 sprays into both nostrils daily. Patient not taking: Reported on 10/24/2018 09/01/18   Mikey College, NP  folic acid (FOLVITE) 1 MG tablet Take  by mouth.    [provider]  ibuprofen (ADVIL,MOTRIN) 200 MG tablet Take 400 mg by mouth every 4 (four) hours as needed.    [provider]  ipratropium (ATROVENT) 0.06 % nasal spray Place 2 sprays into both nostrils 4 (four) times daily for 5 days. 09/01/18 09/06/18  Mikey College, NP  Lactobacillus (ACIDOPHILUS) CAPS capsule Take by mouth.    [provider]  levothyroxine (SYNTHROID) 25 MCG tablet Take 1 tablet (25 mcg total) by mouth daily. 07/20/19   Mikey College, NP  Magnesium Gluconate 550 MG TABS Take by mouth.    [provider]  nicotine (NICODERM CQ - DOSED IN MG/24 HR) 7 mg/24hr patch Place 1 patch (7 mg total) onto the skin daily. Patient not taking: Reported on 09/01/2018 06/27/18   Mikey College, NP  ondansetron (ZOFRAN ODT) 4 MG disintegrating tablet Take 1 tablet (4 mg total) by mouth every 8 (eight) hours as needed for nausea or vomiting. Patient not taking: Reported on 06/27/2018 12/20/17   Darel Hong, MD  Potassium 99 MG TABS Take by mouth.    [provider]  vitamin C (ASCORBIC ACID) 500 MG tablet Take by mouth.    [provider]    Allergies Naproxen sodium and Sulfa antibiotics  Family History  Problem Relation Age of Onset  . Diabetes Mother   . Diabetes Father   . Heart disease Father        CABGx3  . Thyroid disease  Sister        hypothyroid  . Hereditary spherocytosis Daughter   . Hereditary spherocytosis Son     Social History Social History   Tobacco Use  . Smoking status: Current Every Day Smoker    Packs/day: 0.50    Years: 25.00    Pack years: 12.50    Types: Cigarettes  . Smokeless tobacco: Never Used  . Tobacco comment: smokes when she is bored and stressed  Substance Use Topics  . Alcohol use: No    Comment: rarely  . Drug use: Yes    Frequency: 6.0 times per week    Types: Marijuana    Review of Systems Constitutional: No fever/chills Eyes: No visual  changes. ENT: Positive for sore throat.  Cardiovascular: Positive for chest pain. Respiratory: Positive for shortness of breath. Gastrointestinal: No abdominal pain.  Positive for diarrhea.   Genitourinary: Negative for dysuria. Musculoskeletal: Negative for back pain. Skin: Negative for rash. Neurological: Positive for headache.  ____________________________________________   PHYSICAL EXAM:  VITAL SIGNS: ED Triage Vitals  Enc Vitals Group     BP 12/04/19 1633 (!) 156/78     Pulse Rate 12/04/19 1633 96     Resp 12/04/19 1633 20     Temp 12/04/19 1633 98.8 F (37.1 C)     Temp Source 12/04/19 1633 Oral     SpO2 12/04/19 1633 96 %     Weight 12/04/19 1634 175 lb (79.4 kg)     Height 12/04/19 1634 5' (1.524 m)     Head Circumference --      Peak Flow --      Pain Score 12/04/19 1633 5    Constitutional: Alert and oriented.  Eyes: Conjunctivae are normal.  ENT      Head: Normocephalic and atraumatic.      Nose: No congestion/rhinnorhea.      Mouth/Throat: Mucous membranes are moist.      Neck: No stridor. Hematological/Lymphatic/Immunilogical: No cervical lymphadenopathy. Cardiovascular: Normal rate, regular rhythm.  No murmurs, rubs, or gallops.  Respiratory: Normal respiratory effort without tachypnea nor retractions. Breath sounds are clear and equal bilaterally. No wheezes/rales/rhonchi. Gastrointestinal: Soft and non tender. No rebound. No guarding.  Genitourinary: Deferred Musculoskeletal: Normal range of motion in all extremities. No lower extremity edema. Neurologic:  Normal speech and language. No gross focal neurologic deficits are appreciated.  Skin:  Skin is warm, dry and intact. No rash noted. Psychiatric: Mood and affect are normal. Speech and behavior are normal. Patient exhibits appropriate insight and judgment.  ____________________________________________    LABS (pertinent positives/negatives)  Trop hs <2 CBC wbc 13.6, hgb 13.0, plt 629 BMP wnl  except glu 152  ____________________________________________   EKG  I, Nance Pear, attending physician, personally viewed and interpreted this EKG  EKG Time: 1639 Rate: 88 Rhythm: normal sinus rhythm Axis: normal Intervals: qtc 440 QRS: narrow ST changes: no st elevation Impression: normal ekg  ____________________________________________    RADIOLOGY  CXR No acute abnormality  ____________________________________________   PROCEDURES  Procedures  ____________________________________________   INITIAL IMPRESSION / ASSESSMENT AND PLAN / ED COURSE  Pertinent labs & imaging results that were available during my care of the patient were reviewed by me and considered in my medical decision making (see chart for details).   Patient presented to the emergency department today with concern for chest pain and shortness of breath.  Symptoms have been going on for the past 9 days.  She is also had some sore throat, diarrhea.  Patient did have slight leukocytosis.  She states she has tested negative for Covid 2 times although did have an exposure.  Given her symptoms I would have consented to concern for viral illness and possibly Covid.  Chest x-ray troponin both negative here.  I doubt ACS.  Doubt PE.  Patient denies any leg swelling, birth control use, prolonged travel.  Did offer to retest patient for Covid today however she declined.  Did offer albuterol inhaler prescription however she declined that as well.  Discussed with patient importance of following up with primary care.  Also discussed with patient that she could purchase a pulse oximeter to have at home to monitor her oxygen levels.  ____________________________________________   FINAL CLINICAL IMPRESSION(S) / ED DIAGNOSES  Final diagnoses:  Nonspecific chest pain  Shortness of breath     Note: This dictation was prepared with Dragon dictation. Any transcriptional errors that result from this process are  unintentional     Nance Pear, MD 12/04/19 1912

## 2020-01-01 DIAGNOSIS — E039 Hypothyroidism, unspecified: Secondary | ICD-10-CM

## 2020-01-02 MED ORDER — LEVOTHYROXINE SODIUM 25 MCG PO TABS: 25.0000 ug | ORAL_TABLET | Freq: Every day | ORAL | 0 refills | Status: DC

## 2020-04-18 ENCOUNTER — Other Ambulatory Visit: Payer: Self-pay | Admitting: Nurse Practitioner

## 2020-04-18 DIAGNOSIS — E039 Hypothyroidism, unspecified: Secondary | ICD-10-CM

## 2020-04-19 NOTE — Telephone Encounter (Signed)
Call to patient- she is due annual exam- left message to call to schedule- #30 courtesy RF given

## 2020-06-14 ENCOUNTER — Emergency Department: Payer: Self-pay

## 2020-06-14 ENCOUNTER — Other Ambulatory Visit: Payer: Self-pay

## 2020-06-14 ENCOUNTER — Encounter: Payer: Self-pay | Admitting: Emergency Medicine

## 2020-06-14 ENCOUNTER — Emergency Department
Admission: EM | Admit: 2020-06-14 | Discharge: 2020-06-14 | Disposition: A | Discharge status: Home / Self Care | Payer: Self-pay | Attending: Emergency Medicine | Admitting: Emergency Medicine

## 2020-06-14 DIAGNOSIS — Z79899 Other long term (current) drug therapy: Secondary | ICD-10-CM | POA: Insufficient documentation

## 2020-06-14 DIAGNOSIS — E039 Hypothyroidism, unspecified: Secondary | ICD-10-CM | POA: Insufficient documentation

## 2020-06-14 DIAGNOSIS — N200 Calculus of kidney: Secondary | ICD-10-CM | POA: Insufficient documentation

## 2020-06-14 DIAGNOSIS — F17210 Nicotine dependence, cigarettes, uncomplicated: Secondary | ICD-10-CM | POA: Insufficient documentation

## 2020-06-14 DIAGNOSIS — K625 Hemorrhage of anus and rectum: Secondary | ICD-10-CM | POA: Insufficient documentation

## 2020-06-14 LAB — CBC WITH DIFFERENTIAL/PLATELET
Abs Immature Granulocytes: 0.1 10*3/uL — ABNORMAL HIGH (ref 0.00–0.07)
Basophils Absolute: 0.1 10*3/uL (ref 0.0–0.1)
Basophils Relative: 1 %
Eosinophils Absolute: 0.3 10*3/uL (ref 0.0–0.5)
Eosinophils Relative: 2 %
HCT: 38.7 % (ref 36.0–46.0)
Hemoglobin: 13.2 g/dL (ref 12.0–15.0)
Immature Granulocytes: 1 %
Lymphocytes Relative: 35 %
Lymphs Abs: 5.7 10*3/uL — ABNORMAL HIGH (ref 0.7–4.0)
MCH: 29.9 pg (ref 26.0–34.0)
MCHC: 34.1 g/dL (ref 30.0–36.0)
MCV: 87.8 fL (ref 80.0–100.0)
Monocytes Absolute: 1.4 10*3/uL — ABNORMAL HIGH (ref 0.1–1.0)
Monocytes Relative: 8 %
Neutro Abs: 8.9 10*3/uL — ABNORMAL HIGH (ref 1.7–7.7)
Neutrophils Relative %: 53 %
Platelets: 562 10*3/uL — ABNORMAL HIGH (ref 150–400)
RBC: 4.41 MIL/uL (ref 3.87–5.11)
RDW: 13.2 % (ref 11.5–15.5)
WBC: 16.5 10*3/uL — ABNORMAL HIGH (ref 4.0–10.5)
nRBC: 0 % (ref 0.0–0.2)

## 2020-06-14 LAB — LIPASE, BLOOD: Lipase: 32 U/L (ref 11–51)

## 2020-06-14 LAB — URINALYSIS, COMPLETE (UACMP) WITH MICROSCOPIC
Bacteria, UA: NONE SEEN
Bilirubin Urine: NEGATIVE
Glucose, UA: NEGATIVE mg/dL
Ketones, ur: NEGATIVE mg/dL
Leukocytes,Ua: NEGATIVE
Nitrite: NEGATIVE
Protein, ur: NEGATIVE mg/dL
Specific Gravity, Urine: 1.013 (ref 1.005–1.030)
pH: 6 (ref 5.0–8.0)

## 2020-06-14 LAB — COMPREHENSIVE METABOLIC PANEL
ALT: 19 U/L (ref 0–44)
AST: 20 U/L (ref 15–41)
Albumin: 4.1 g/dL (ref 3.5–5.0)
Alkaline Phosphatase: 66 U/L (ref 38–126)
Anion gap: 11 (ref 5–15)
BUN: 16 mg/dL (ref 6–20)
CO2: 23 mmol/L (ref 22–32)
Calcium: 8.3 mg/dL — ABNORMAL LOW (ref 8.9–10.3)
Chloride: 105 mmol/L (ref 98–111)
Creatinine, Ser: 0.54 mg/dL (ref 0.44–1.00)
GFR calc Af Amer: >60 mL/min (ref >60)
GFR calc non Af Amer: >60 mL/min (ref >60)
Glucose, Bld: 159 mg/dL — ABNORMAL HIGH (ref 70–99)
Potassium: 3.6 mmol/L (ref 3.5–5.1)
Sodium: 139 mmol/L (ref 135–145)
Total Bilirubin: 0.9 mg/dL (ref 0.3–1.2)
Total Protein: 7.1 g/dL (ref 6.5–8.1)

## 2020-06-14 LAB — TYPE AND SCREEN
ABO/RH(D): A POS
Antibody Screen: NEGATIVE

## 2020-06-14 MED ORDER — IOHEXOL 300 MG/ML  SOLN
100.0000 mL | Freq: Once | INTRAMUSCULAR | Status: AC | PRN
  Administered 2020-06-14: 100 mL via INTRAVENOUS

## 2020-06-14 MED ORDER — IOHEXOL 9 MG/ML PO SOLN
1000.0000 mL | Freq: Once | ORAL | Status: AC | PRN
  Administered 2020-06-14: 1000 mL via ORAL

## 2020-06-14 NOTE — ED Notes (Signed)
Pt upset about wait times.  Attempted to explain reason for wait.

## 2020-06-14 NOTE — ED Provider Notes (Signed)
[facility] Emergency Department Provider Note   ____________________________________________   First MD Initiated Contact with Patient 06/14/20 1037     (approximate)  I have reviewed the triage vital signs and the nursing notes.   HISTORY  Chief Complaint Rectal Bleeding    HPI Mallory Deleon is a 44 y.o. female reports previous history of kidney stones  Patient noticed yesterday that she had a small amount of blood in the toilet bowl after using the bathroom.  Then again also this morning, she shows me a picture which demonstrates some red blood in the toilet.  She reports her stool has been brown.  About 2 weeks ago she had couple days of loose stools diarrhea and crampy abdominal pain.  No fevers or chills.  No travel history.  No known Covid exposure  She is also been experiencing the last couple of days an achy discomfort in her lower abdomen.  No vomiting.  No fever.  She is never had any blood in her stool before.  Does not take any blood thinners does occasionally take ibuprofen perhaps once a day while at work  Has never had a colonoscopy.   Past Medical History:  Diagnosis Date  . Kidney stones 2010  . Thyroid disease     Patient Active Problem List   Diagnosis Date Noted  . Hypothyroidism 06/30/2018  . History of kidney stones 06/30/2018  . Diarrhea 06/30/2016    Past Surgical History:  Procedure Laterality Date  . ABDOMINAL HYSTERECTOMY    . LITHOTRIPSY Bilateral   . SPLENECTOMY    . TUBAL LIGATION      Prior to Admission medications   Medication Sig Start Date End Date Taking? Authorizing Provider  b complex vitamins tablet Take by mouth.    [provider]  dicyclomine (BENTYL) 20 MG tablet Take 1 tablet (20 mg total) by mouth 3 (three) times daily as needed for spasms. 12/20/17 12/20/18  Darel Hong, MD  Ergocalciferol (VITAMIN D2) 2000 units TABS Take by mouth.    [provider]  EUTHYROX 25 MCG  tablet Take 1 tablet by mouth once daily 04/19/20   Karamalegos, Devonne Doughty, DO  fluticasone (FLONASE) 50 MCG/ACT nasal spray Place 2 sprays into both nostrils daily. Patient not taking: Reported on 10/24/2018 09/01/18   Mikey College, NP  folic acid (FOLVITE) 1 MG tablet Take by mouth.    [provider]  ibuprofen (ADVIL,MOTRIN) 200 MG tablet Take 400 mg by mouth every 4 (four) hours as needed.    [provider]  ipratropium (ATROVENT) 0.06 % nasal spray Place 2 sprays into both nostrils 4 (four) times daily for 5 days. 09/01/18 09/06/18  Mikey College, NP  Lactobacillus (ACIDOPHILUS) CAPS capsule Take by mouth.    [provider]  Magnesium Gluconate 550 MG TABS Take by mouth.    [provider]  nicotine (NICODERM CQ - DOSED IN MG/24 HR) 7 mg/24hr patch Place 1 patch (7 mg total) onto the skin daily. Patient not taking: Reported on 09/01/2018 06/27/18   Mikey College, NP  ondansetron (ZOFRAN ODT) 4 MG disintegrating tablet Take 1 tablet (4 mg total) by mouth every 8 (eight) hours as needed for nausea or vomiting. Patient not taking: Reported on 06/27/2018 12/20/17   Darel Hong, MD  Potassium 99 MG TABS Take by mouth.    [provider]  vitamin C (ASCORBIC ACID) 500 MG tablet Take by mouth.    [provider]    Allergies Naproxen sodium and Sulfa antibiotics  Family History  Problem Relation Age of Onset  . Diabetes Mother   . Diabetes Father   . Heart disease Father        CABGx3  . Thyroid disease Sister        hypothyroid  . Hereditary spherocytosis Daughter   . Hereditary spherocytosis Son     Social History Social History   Tobacco Use  . Smoking status: Current Every Day Smoker    Packs/day: 0.50    Years: 25.00    Pack years: 12.50    Types: Cigarettes  . Smokeless tobacco: Never Used  . Tobacco comment: smokes when she is bored and stressed  Vaping Use  . Vaping Use: Every day    Substance Use Topics  . Alcohol use: No    Comment: rarely  . Drug use: Yes    Frequency: 6.0 times per week    Types: Marijuana    Review of Systems Constitutional: No fever/chills ENT: No sore throat. Cardiovascular: Denies chest pain. Respiratory: Denies shortness of breath. Gastrointestinal: See HPI Genitourinary: Negative for dysuria. Musculoskeletal: Negative for back pain. Skin: Negative for rash. Neurological: Negative for headaches.  No black stools.  States stool primarily normal in color but occasionally seeing blood.  Does have a small hemorrhoid which has been present for a long time and unchanged.  No rectal pain  ____________________________________________   PHYSICAL EXAM:  VITAL SIGNS: ED Triage Vitals [06/14/20 0415]  Enc Vitals Group     BP (!) 140/97     Pulse Rate 83     Resp 18     Temp 98 F (36.7 C)     Temp Source Oral     SpO2 97 %     Weight 176 lb (79.8 kg)     Height 4\' 11"  (1.499 m)     Head Circumference      Peak Flow      Pain Score      Pain Loc      Pain Edu?      Excl. in [place]?     Constitutional: Alert and oriented. Well appearing and in no acute distress. Eyes: Conjunctivae are normal. Head: Atraumatic. Nose: No congestion/rhinnorhea. Mouth/Throat: Mucous membranes are moist. Neck: No stridor.  Cardiovascular: Normal rate, regular rhythm. Grossly normal heart sounds.  Good peripheral circulation. Respiratory: Normal respiratory effort.  No retractions. Lungs CTAB. Gastrointestinal: Soft and nontender except she does report mild tenderness in the suprapubic and left lower quadrant without rebound or guarding. No distention.  Rectal exam performed with nurse today, brown nonbloody stool.  Small external hemorrhoid that is nontender.  No noted internal hemorrhoids to exam Musculoskeletal: No lower extremity tenderness nor edema. Neurologic:  Normal speech and language. No gross focal neurologic deficits are appreciated.   Skin:  Skin is warm, dry and intact. No rash noted. Psychiatric: Mood and affect are normal. Speech and behavior are normal.  ____________________________________________   LABS (all labs ordered are listed, but only abnormal results are displayed)  Labs Reviewed  CBC WITH DIFFERENTIAL/PLATELET - Abnormal; Notable for the following components:      Result Value   WBC 16.5 (*)    Platelets 562 (*)    Neutro Abs 8.9 (*)    Lymphs Abs 5.7 (*)    Monocytes Absolute 1.4 (*)    Abs Immature Granulocytes 0.10 (*)    All other components within normal limits  COMPREHENSIVE METABOLIC  PANEL - Abnormal; Notable for the following components:   Glucose, Bld 159 (*)    Calcium 8.3 (*)    All other components within normal limits  URINALYSIS, COMPLETE (UACMP) WITH MICROSCOPIC - Abnormal; Notable for the following components:   Color, Urine YELLOW (*)    APPearance CLEAR (*)    Hgb urine dipstick MODERATE (*)    All other components within normal limits  LIPASE, BLOOD  TYPE AND SCREEN   ____________________________________________  EKG   ____________________________________________  RADIOLOGY  CT ABDOMEN PELVIS W CONTRAST  Result Date: 06/14/2020 CLINICAL DATA:  Left lower quadrant pain, rectal bleeding EXAM: CT ABDOMEN AND PELVIS WITH CONTRAST TECHNIQUE: Multidetector CT imaging of the abdomen and pelvis was performed using the standard protocol following bolus administration of intravenous contrast. CONTRAST:  173mL OMNIPAQUE IOHEXOL 300 MG/ML  SOLN COMPARISON:  CT 02/15/2013 FINDINGS: Lower chest: No acute abnormality. Hepatobiliary: No focal liver abnormality is seen. No gallstones, gallbladder wall thickening, or biliary dilatation. Pancreas: Unremarkable. No pancreatic ductal dilatation or surrounding inflammatory changes. Spleen: Postsurgical changes of splenectomy. Adrenals/Urinary Tract: Unremarkable adrenal glands. Multiple nonobstructing right renal calculi largest within the  inferior pole measures 10 x 7 mm. There are a few tiny 1-2 mm left renal calculi. No hydronephrosis. Bilateral ureters are nondilated. No ureteral calculi identified. Urinary bladder is unremarkable for the degree of distension. Stomach/Bowel: Stomach is within normal limits. Appendix appears normal (series 2, image 71). No evidence of bowel wall thickening, distention, or inflammatory changes. Vascular/Lymphatic: There are a few mildly prominent mesenteric lymph nodes within the right lower quadrant, unchanged from prior CT. No abdominopelvic lymphadenopathy. No acute or significant vascular findings. Reproductive: Status post hysterectomy. No adnexal masses. Other: No free fluid. No intra-abdominal fluid collection. No free air. No abdominal wall hernia. Musculoskeletal: No acute or significant osseous findings. IMPRESSION: 1. No acute abdominopelvic findings. 2. Bilateral nonobstructing nephrolithiasis. Electronically Signed   By: Davina Poke D.O.   On: 06/14/2020 13:20    CT scan negative for acute findings.  Bilateral nonobstructing kidney stones noted.  Discussed with the patient these findings, she is aware of her history of kidney stones. ____________________________________________   PROCEDURES  Procedure(s) performed: None  Procedures  Critical Care performed: No  ____________________________________________   INITIAL IMPRESSION / ASSESSMENT AND PLAN / ED COURSE  Pertinent labs & imaging results that were available during my care of the patient were reviewed by me and considered in my medical decision making (see chart for details).   Rectal bleeding.  Appears to be relatively small in quantity.  Hemoglobin reassuring.  Normal vital signs except for slight hypertension.  Exam does show some discomfort suprapubic left lower quadrant in association with having had some loose stools a week or 2 ago and discomfort in this area as well as some slight blood will proceed with imaging  studies to evaluate for other causes such as colitis, mass, lesion, diverticulitis etc.  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  Resting comfortably.  Reviewed CT scan with patient which is reassuring.  I am suspicious for possible causes such as hemorrhoids, polyp, etc. and do not see evidence of severe bleeding at this time.  Discussed with the patient, encouraged her to set up close follow-up with gastroenterology and I recommended  she consider having a colonoscopy done.  She is agreeable to this plan, does have some insurance issues that she is working through with a new employer, and did provide information regarding financial aid and follow-up recommendations with Dr. Alice Reichert.  Return precautions and treatment recommendations and follow-up discussed with the patient who is agreeable with the plan.       ____________________________________________   FINAL CLINICAL IMPRESSION(S) / ED DIAGNOSES  Final diagnoses:  Rectal bleeding  Bilateral kidney stones        Note:  This document was prepared using Dragon voice recognition software and may include unintentional dictation errors       Delman Kitten, MD 06/14/20 1358

## 2020-06-14 NOTE — ED Notes (Signed)
Patient discharged home, patient received discharge papers. Patient appropriate and cooperative. Vital signs taken. NAD noted. 

## 2020-06-14 NOTE — ED Notes (Signed)
Pt only able to tolerate 1 bottle of oral contrast. CT informed.

## 2020-06-14 NOTE — ED Triage Notes (Signed)
Patient ambulatory to triage with steady gait, without difficulty or distress noted; pt reports episode of rectal bleeding x 1 yesterday and again this am; denies hx of same; denies accomp symptoms

## 2020-06-14 NOTE — ED Notes (Addendum)
Assisted Dr. Jacqualine Code with rectal examination

## 2021-06-20 ENCOUNTER — Encounter: Payer: Self-pay | Admitting: Internal Medicine

## 2021-06-20 ENCOUNTER — Other Ambulatory Visit: Payer: Self-pay

## 2021-06-20 ENCOUNTER — Other Ambulatory Visit (HOSPITAL_COMMUNITY)
Admission: RE | Admit: 2021-06-20 | Discharge: 2021-06-20 | Disposition: A | Discharge status: Home / Self Care | Payer: Self-pay | Source: Ambulatory Visit | Attending: Internal Medicine | Admitting: Internal Medicine

## 2021-06-20 ENCOUNTER — Ambulatory Visit (INDEPENDENT_AMBULATORY_CARE_PROVIDER_SITE_OTHER): Payer: Managed Care, Other (non HMO) | Admitting: Internal Medicine

## 2021-06-20 VITALS — BP 121/86 | HR 76 | Temp 97.5°F | Resp 18 | Wt 165.6 lb

## 2021-06-20 DIAGNOSIS — E7800 Pure hypercholesterolemia, unspecified: Secondary | ICD-10-CM | POA: Diagnosis not present

## 2021-06-20 DIAGNOSIS — E039 Hypothyroidism, unspecified: Secondary | ICD-10-CM

## 2021-06-20 DIAGNOSIS — Z6833 Body mass index (BMI) 33.0-33.9, adult: Secondary | ICD-10-CM

## 2021-06-20 DIAGNOSIS — Z113 Encounter for screening for infections with a predominantly sexual mode of transmission: Secondary | ICD-10-CM

## 2021-06-20 DIAGNOSIS — Z0001 Encounter for general adult medical examination with abnormal findings: Secondary | ICD-10-CM | POA: Diagnosis not present

## 2021-06-20 DIAGNOSIS — E785 Hyperlipidemia, unspecified: Secondary | ICD-10-CM | POA: Insufficient documentation

## 2021-06-20 DIAGNOSIS — E6609 Other obesity due to excess calories: Secondary | ICD-10-CM

## 2021-06-20 DIAGNOSIS — Z1211 Encounter for screening for malignant neoplasm of colon: Secondary | ICD-10-CM

## 2021-06-20 DIAGNOSIS — Z6836 Body mass index (BMI) 36.0-36.9, adult: Secondary | ICD-10-CM | POA: Insufficient documentation

## 2021-06-20 NOTE — Progress Notes (Signed)
Subjective:    Patient ID: Mallory Deleon, female    DOB: August 18, 1976, 45 y.o.   MRN: [id number]  HPI  Pt presents to the clinic today for her annual exam.   HLD: Her last LDL was 127, triglycerides 76, 02/2018. She is not taking cholesterol lowering medications at this time.  Hypothyroidism: She is not currently taking any Levothyroxine. She is not following with endocrinology.  Flu: 06/2017 Tetanus: 03/2017 Covid: never Pap Smear: 07/2017 hysterectomy Mammogram: > 2 years ago  Colon Screening: never Vision Screening: annually Dentist: biannually  Diet: She rarely eats meat. She does eat fruits and veggies. She tries to avoid fried foods. She drinks mostly green tea, water. Exercise: Pilates and yoga  Review of Systems     Past Medical History:  Diagnosis Date   Kidney stones 2010   Thyroid disease     Current Outpatient Medications  Medication Sig Dispense Refill   b complex vitamins tablet Take by mouth.     dicyclomine (BENTYL) 20 MG tablet Take 1 tablet (20 mg total) by mouth 3 (three) times daily as needed for spasms. 30 tablet 0   Ergocalciferol (VITAMIN D2) 2000 units TABS Take by mouth.     EUTHYROX 25 MCG tablet Take 1 tablet by mouth once daily 90 tablet 0   fluticasone (FLONASE) 50 MCG/ACT nasal spray Place 2 sprays into both nostrils daily. (Patient not taking: Reported on [id number]) 16 g 6   folic acid (FOLVITE) 1 MG tablet Take by mouth.     ibuprofen (ADVIL,MOTRIN) 200 MG tablet Take 400 mg by mouth every 4 (four) hours as needed.     ipratropium (ATROVENT) 0.06 % nasal spray Place 2 sprays into both nostrils 4 (four) times daily for 5 days. 15 mL 0   Lactobacillus (ACIDOPHILUS) CAPS capsule Take by mouth.     Magnesium Gluconate 550 MG TABS Take by mouth.     nicotine (NICODERM CQ - DOSED IN MG/24 HR) 7 mg/24hr patch Place 1 patch (7 mg total) onto the skin daily. (Patient not taking: Reported on 09/01/2018) 28 patch 0   ondansetron (ZOFRAN ODT) 4 MG  disintegrating tablet Take 1 tablet (4 mg total) by mouth every 8 (eight) hours as needed for nausea or vomiting. (Patient not taking: Reported on 06/27/2018) 20 tablet 0   Potassium 99 MG TABS Take by mouth.     vitamin C (ASCORBIC ACID) 500 MG tablet Take by mouth.     No current facility-administered medications for this visit.    Allergies  Allergen Reactions   Naproxen Sodium Itching and Rash   Sulfa Antibiotics Hives and Rash    Family History  Problem Relation Age of Onset   Diabetes Mother    Diabetes Father    Heart disease Father        CABGx3   Thyroid disease Sister        hypothyroid   Hereditary spherocytosis Daughter    Hereditary spherocytosis Son     Social History   Socioeconomic History   Marital status: Single    Spouse name: Not on file   Number of children: 3   Years of education: Not on file   Highest education level: Not on file  Occupational History   Not on file  Tobacco Use   Smoking status: Every Day    Packs/day: 0.50    Years: 25.00    Pack years: 12.50    Types: Cigarettes   Smokeless tobacco: Never  Tobacco comments:    smokes when she is bored and stressed  Vaping Use   Vaping Use: Every day  Substance and Sexual Activity   Alcohol use: No    Comment: rarely   Drug use: Yes    Frequency: 6.0 times per week    Types: Marijuana   Sexual activity: Yes  Other Topics Concern   Not on file  Social History Narrative   Not on file   Social Determinants of Health   Financial Resource Strain: Not on file  Food Insecurity: Not on file  Transportation Needs: Not on file  Physical Activity: Not on file  Stress: Not on file  Social Connections: Not on file  Intimate Partner Violence: Not on file     Constitutional: Denies fever, malaise, fatigue, headache or abrupt weight changes.  HEENT: Denies eye pain, eye redness, ear pain, ringing in the ears, wax buildup, runny nose, nasal congestion, bloody nose, or sore  throat. Respiratory: Denies difficulty breathing, shortness of breath, cough or sputum production.   Cardiovascular: Denies chest pain, chest tightness, palpitations or swelling in the hands or feet.  Gastrointestinal: Denies abdominal pain, bloating, constipation, diarrhea or blood in the stool.  GU: Denies urgency, frequency, pain with urination, burning sensation, blood in urine, odor or discharge. Musculoskeletal: Denies decrease in range of motion, difficulty with gait, muscle pain or joint pain and swelling.  Skin: Denies redness, rashes, lesions or ulcercations.  Neurological: Denies dizziness, difficulty with memory, difficulty with speech or problems with balance and coordination.  Psych: Denies anxiety, depression, SI/HI.  No other specific complaints in a complete review of systems (except as listed in HPI above).  Objective:   Physical Exam  BP 121/86 (BP Location: Right Arm, Patient Position: Sitting, Cuff Size: Normal)   Pulse 76   Temp (!) 97.5 F (36.4 C) (Temporal)   Resp 18   Wt 165 lb 9.6 oz (75.1 kg)   SpO2 98%   BMI 33.45 kg/m   Wt Readings from Last 3 Encounters:  06/14/20 176 lb (79.8 kg)  12/04/19 175 lb (79.4 kg)  10/24/18 179 lb (81.2 kg)    General: Appears her stated age, obese, in NAD. Skin: Warm, dry and intact. No rashes noted. HEENT: Head: normal shape and size; Eyes: sclera white and EOMs intact;  Neck:  Neck supple, trachea midline. No masses, lumps present. Thyromegaly noted. Cardiovascular: Normal rate and rhythm. S1,S2 noted.  No murmur, rubs or gallops noted. No JVD or BLE edema.  Pulmonary/Chest: Normal effort and positive vesicular breath sounds. No respiratory distress. No wheezes, rales or ronchi noted.  Abdomen: Soft and nontender. Normal bowel sounds. No distention or masses noted. Liver, spleen and kidneys non palpable. Musculoskeletal: Strength 5/5 BUE/BLE. No difficulty with gait.  Neurological: Alert and oriented. Cranial nerves  II-XII grossly intact. Coordination normal.  Psychiatric: Mood and affect normal. Behavior is normal. Judgment and thought content normal.    BMET    Component Value Date/Time   NA 139 06/14/2020 0416   NA 137 02/20/2013 2253   K 3.6 06/14/2020 0416   K 4.1 02/20/2013 2253   CL 105 06/14/2020 0416   CL 107 02/20/2013 2253   CO2 23 06/14/2020 0416   CO2 24 02/20/2013 2253   GLUCOSE 159 (H) 06/14/2020 0416   GLUCOSE 96 02/20/2013 2253   BUN 16 06/14/2020 0416   BUN 9 02/20/2013 2253   CREATININE 0.54 06/14/2020 0416   CREATININE 0.46 (L) 06/29/2018 0827   CALCIUM  8.3 (L) 06/14/2020 0416   CALCIUM 9.2 02/20/2013 2253   GFRNONAA >60 06/14/2020 0416   GFRNONAA 123 06/29/2018 0827   GFRAA >60 06/14/2020 0416   GFRAA 142 06/29/2018 0827    Lipid Panel     Component Value Date/Time   CHOL 199 06/29/2018 0827   TRIG 76 06/29/2018 0827   HDL 55 06/29/2018 0827   CHOLHDL 3.6 06/29/2018 0827   VLDL 35 (H) 03/31/2017 0849   LDLCALC 127 (H) 06/29/2018 0827    CBC    Component Value Date/Time   WBC 16.5 (H) 06/14/2020 0416   RBC 4.41 06/14/2020 0416   HGB 13.2 06/14/2020 0416   HGB 14.0 02/20/2013 2253   HCT 38.7 06/14/2020 0416   HCT 41.1 02/20/2013 2253   PLT 562 (H) 06/14/2020 0416   PLT 625 (H) 02/20/2013 2253   MCV 87.8 06/14/2020 0416   MCV 72 (L) 02/20/2013 2253   MCH 29.9 06/14/2020 0416   MCHC 34.1 06/14/2020 0416   RDW 13.2 06/14/2020 0416   RDW 19.0 (H) 02/20/2013 2253   LYMPHSABS 5.7 (H) 06/14/2020 0416   MONOABS 1.4 (H) 06/14/2020 0416   EOSABS 0.3 06/14/2020 0416   BASOSABS 0.1 06/14/2020 0416    Hgb A1C Lab Results  Component Value Date   HGBA1C 5.3 06/29/2018           Assessment & Plan:   Preventative Health Maintenance:  Encouraged her to get a flu shot in the fall Tetanus UTD Encouraged her to get her covid vaccine Pap smear scheduled next week She would like her GYN to schedule her mammogram for her- has appt next week Referral  to GI for screening colonoscopy Encouraged her to consume a balanced diet and exercise regimen Advised her to see an eye doctor and dentist annually Will check TSH, Free T4, lipid, A1C  Screen for STD:  Will check HIV, RPR and Hep C today Wet prep for BV, yeast, gonorrhea, chlamydia and trichomonas RTC in 1 year, sooner if needed Webb Silversmith, NP This visit occurred during the SARS-CoV-2 public health emergency.  Safety protocols were in place, including screening questions prior to the visit, additional usage of staff PPE, and extensive cleaning of exam room while observing appropriate contact time as indicated for disinfecting solutions.

## 2021-06-20 NOTE — Patient Instructions (Signed)
Health Maintenance, Female Adopting a healthy lifestyle and getting preventive care are important in promoting health and wellness. Ask your health care provider about: The right schedule for you to have regular tests and exams. Things you can do on your own to prevent diseases and keep yourself healthy. What should I know about diet, weight, and exercise? Eat a healthy diet  Eat a diet that includes plenty of vegetables, fruits, low-fat dairy products, and lean protein. Do not eat a lot of foods that are high in solid fats, added sugars, or sodium.  Maintain a healthy weight Body mass index (BMI) is used to identify weight problems. It estimates body fat based on height and weight. Your health care provider can help determineyour BMI and help you achieve or maintain a healthy weight. Get regular exercise Get regular exercise. This is one of the most important things you can do for your health. Most adults should: Exercise for at least 150 minutes each week. The exercise should increase your heart rate and make you sweat (moderate-intensity exercise). Do strengthening exercises at least twice a week. This is in addition to the moderate-intensity exercise. Spend less time sitting. Even light physical activity can be beneficial. Watch cholesterol and blood lipids Have your blood tested for lipids and cholesterol at 45 years of age, then havethis test every 5 years. Have your cholesterol levels checked more often if: Your lipid or cholesterol levels are high. You are older than 45 years of age. You are at high risk for heart disease. What should I know about cancer screening? Depending on your health history and family history, you may need to have cancer screening at various ages. This may include screening for: Breast cancer. Cervical cancer. Colorectal cancer. Skin cancer. Lung cancer. What should I know about heart disease, diabetes, and high blood pressure? Blood pressure and heart  disease High blood pressure causes heart disease and increases the risk of stroke. This is more likely to develop in people who have high blood pressure readings, are of African descent, or are overweight. Have your blood pressure checked: Every 3-5 years if you are 18-39 years of age. Every year if you are 40 years old or older. Diabetes Have regular diabetes screenings. This checks your fasting blood sugar level. Have the screening done: Once every three years after age 40 if you are at a normal weight and have a low risk for diabetes. More often and at a younger age if you are overweight or have a high risk for diabetes. What should I know about preventing infection? Hepatitis B If you have a higher risk for hepatitis B, you should be screened for this virus. Talk with your health care provider to find out if you are at risk forhepatitis B infection. Hepatitis C Testing is recommended for: Everyone born from 1945 through 1965. Anyone with known risk factors for hepatitis C. Sexually transmitted infections (STIs) Get screened for STIs, including gonorrhea and chlamydia, if: You are sexually active and are younger than 45 years of age. You are older than 45 years of age and your health care provider tells you that you are at risk for this type of infection. Your sexual activity has changed since you were last screened, and you are at increased risk for chlamydia or gonorrhea. Ask your health care provider if you are at risk. Ask your health care provider about whether you are at high risk for HIV. Your health care provider may recommend a prescription medicine to help   prevent HIV infection. If you choose to take medicine to prevent HIV, you should first get tested for HIV. You should then be tested every 3 months for as long as you are taking the medicine. Pregnancy If you are about to stop having your period (premenopausal) and you may become pregnant, seek counseling before you get  pregnant. Take 400 to 800 micrograms (mcg) of folic acid every day if you become pregnant. Ask for birth control (contraception) if you want to prevent pregnancy. Osteoporosis and menopause Osteoporosis is a disease in which the bones lose minerals and strength with aging. This can result in bone fractures. If you are 65 years old or older, or if you are at risk for osteoporosis and fractures, ask your health care provider if you should: Be screened for bone loss. Take a calcium or vitamin D supplement to lower your risk of fractures. Be given hormone replacement therapy (HRT) to treat symptoms of menopause. Follow these instructions at home: Lifestyle Do not use any products that contain nicotine or tobacco, such as cigarettes, e-cigarettes, and chewing tobacco. If you need help quitting, ask your health care provider. Do not use street drugs. Do not share needles. Ask your health care provider for help if you need support or information about quitting drugs. Alcohol use Do not drink alcohol if: Your health care provider tells you not to drink. You are pregnant, may be pregnant, or are planning to become pregnant. If you drink alcohol: Limit how much you use to 0-1 drink a day. Limit intake if you are breastfeeding. Be aware of how much alcohol is in your drink. In the [place] one drink equals one 12 oz bottle of beer (355 mL), one 5 oz glass of wine (148 mL), or one 1 oz glass of hard liquor (44 mL). General instructions Schedule regular health, dental, and eye exams. Stay current with your vaccines. Tell your health care provider if: You often feel depressed. You have ever been abused or do not feel safe at home. Summary Adopting a healthy lifestyle and getting preventive care are important in promoting health and wellness. Follow your health care provider's instructions about healthy diet, exercising, and getting tested or screened for diseases. Follow your health care provider's  instructions on monitoring your cholesterol and blood pressure. This information is not intended to replace advice given to you by your health care provider. Make sure you discuss any questions you have with your healthcare provider. Document Revised: 10/26/2018 Document Reviewed: 10/26/2018 Elsevier Patient Education  2022 Elsevier Inc.  

## 2021-06-20 NOTE — Assessment & Plan Note (Signed)
Encouraged diet and exercise for weight loss ?

## 2021-06-20 NOTE — Assessment & Plan Note (Signed)
CMET and lipid profile today Encouraged her to consume a low fat diet 

## 2021-06-20 NOTE — Assessment & Plan Note (Signed)
TSH and Free T4 today Will start Levothyroxine if needed based on labs

## 2021-06-22 NOTE — Addendum Note (Signed)
Addended by: Jearld Fenton on: 06/22/2021 03:19 PM   Modules accepted: Orders

## 2021-06-24 ENCOUNTER — Encounter: Payer: Self-pay | Admitting: Internal Medicine

## 2021-06-24 DIAGNOSIS — Z1231 Encounter for screening mammogram for malignant neoplasm of breast: Secondary | ICD-10-CM

## 2021-06-24 LAB — HEPATITIS C ANTIBODY
Hepatitis C Ab: NONREACTIVE
SIGNAL TO CUT-OFF: 0.01 (ref <1.00)

## 2021-06-24 LAB — CERVICOVAGINAL ANCILLARY ONLY
Bacterial Vaginitis (gardnerella): POSITIVE — AB
Candida Glabrata: NEGATIVE
Candida Vaginitis: NEGATIVE
Chlamydia: NEGATIVE
Comment: NEGATIVE
Comment: NEGATIVE
Comment: NEGATIVE
Comment: NEGATIVE
Comment: NEGATIVE
Comment: NORMAL
Neisseria Gonorrhea: NEGATIVE
Trichomonas: NEGATIVE

## 2021-06-24 LAB — CBC
HCT: 42 % (ref 35.0–45.0)
Hemoglobin: 14.5 g/dL (ref 11.7–15.5)
MCH: 30.6 pg (ref 27.0–33.0)
MCHC: 34.5 g/dL (ref 32.0–36.0)
MCV: 88.6 fL (ref 80.0–100.0)
MPV: 10.5 fL (ref 7.5–12.5)
Platelets: 577 10*3/uL — ABNORMAL HIGH (ref 140–400)
RBC: 4.74 10*6/uL (ref 3.80–5.10)
RDW: 13.2 % (ref 11.0–15.0)
WBC: 12.8 10*3/uL — ABNORMAL HIGH (ref 3.8–10.8)

## 2021-06-24 LAB — COMPLETE METABOLIC PANEL WITH GFR
AG Ratio: 1.8 (calc) (ref 1.0–2.5)
ALT: 11 U/L (ref 6–29)
AST: 13 U/L (ref 10–35)
Albumin: 4.5 g/dL (ref 3.6–5.1)
Alkaline phosphatase (APISO): 74 U/L (ref 31–125)
BUN: 11 mg/dL (ref 7–25)
CO2: 24 mmol/L (ref 20–32)
Calcium: 9.7 mg/dL (ref 8.6–10.2)
Chloride: 107 mmol/L (ref 98–110)
Creat: 0.66 mg/dL (ref 0.50–0.99)
Globulin: 2.5 g/dL (calc) (ref 1.9–3.7)
Glucose, Bld: 87 mg/dL (ref 65–99)
Potassium: 4.4 mmol/L (ref 3.5–5.3)
Sodium: 140 mmol/L (ref 135–146)
Total Bilirubin: 0.7 mg/dL (ref 0.2–1.2)
Total Protein: 7 g/dL (ref 6.1–8.1)
eGFR: 110 mL/min/{1.73_m2} (ref >=60)

## 2021-06-24 LAB — LIPID PANEL
Cholesterol: 224 mg/dL — ABNORMAL HIGH (ref <200)
HDL: 45 mg/dL — ABNORMAL LOW (ref >=50)
LDL Cholesterol (Calc): 153 mg/dL (calc) — ABNORMAL HIGH
Non-HDL Cholesterol (Calc): 179 mg/dL (calc) — ABNORMAL HIGH (ref <130)
Total CHOL/HDL Ratio: 5 (calc) — ABNORMAL HIGH (ref <5.0)
Triglycerides: 132 mg/dL (ref <150)

## 2021-06-24 LAB — TSH: TSH: 1.23 mIU/L

## 2021-06-24 LAB — HEMOGLOBIN A1C
Hgb A1c MFr Bld: 5.2 % of total Hgb (ref <5.7)
Mean Plasma Glucose: 103 mg/dL
eAG (mmol/L): 5.7 mmol/L

## 2021-06-24 LAB — HIV ANTIBODY (ROUTINE TESTING W REFLEX): HIV 1&2 Ab, 4th Generation: NONREACTIVE

## 2021-06-24 LAB — T4, FREE: Free T4: 1.1 ng/dL (ref 0.8–1.8)

## 2021-06-25 ENCOUNTER — Encounter: Payer: Self-pay | Admitting: Internal Medicine

## 2021-06-25 MED ORDER — METRONIDAZOLE 0.75 % VA GEL: 1.0000 | Freq: Two times a day (BID) | VAGINAL | 0 refills | Status: DC

## 2021-06-27 ENCOUNTER — Encounter: Payer: Self-pay | Admitting: Obstetrics & Gynecology

## 2021-06-30 ENCOUNTER — Other Ambulatory Visit: Payer: Self-pay

## 2021-06-30 DIAGNOSIS — Z1211 Encounter for screening for malignant neoplasm of colon: Secondary | ICD-10-CM

## 2021-06-30 MED ORDER — NA SULFATE-K SULFATE-MG SULF 17.5-3.13-1.6 GM/177ML PO SOLN: 1.0000 | ORAL | 0 refills | Status: DC

## 2021-06-30 NOTE — Telephone Encounter (Signed)
I have reviewed and sent in Metrogel on 8/10

## 2021-07-14 ENCOUNTER — Encounter: Payer: Self-pay | Admitting: Gastroenterology

## 2021-07-25 ENCOUNTER — Encounter
Admission: RE | Disposition: A | Discharge status: Home / Self Care | Payer: Self-pay | Source: Home / Self Care | Attending: Gastroenterology

## 2021-07-25 ENCOUNTER — Encounter: Payer: Self-pay | Admitting: Gastroenterology

## 2021-07-25 ENCOUNTER — Ambulatory Visit: Payer: Managed Care, Other (non HMO) | Admitting: Anesthesiology

## 2021-07-25 ENCOUNTER — Ambulatory Visit
Admission: RE | Admit: 2021-07-25 | Discharge: 2021-07-25 | Disposition: A | Discharge status: Home / Self Care | Payer: Managed Care, Other (non HMO) | Attending: Gastroenterology | Admitting: Gastroenterology

## 2021-07-25 ENCOUNTER — Other Ambulatory Visit: Payer: Self-pay

## 2021-07-25 DIAGNOSIS — F17210 Nicotine dependence, cigarettes, uncomplicated: Secondary | ICD-10-CM | POA: Diagnosis not present

## 2021-07-25 DIAGNOSIS — K635 Polyp of colon: Secondary | ICD-10-CM | POA: Insufficient documentation

## 2021-07-25 DIAGNOSIS — Z1211 Encounter for screening for malignant neoplasm of colon: Secondary | ICD-10-CM

## 2021-07-25 DIAGNOSIS — Z79899 Other long term (current) drug therapy: Secondary | ICD-10-CM | POA: Diagnosis not present

## 2021-07-25 DIAGNOSIS — K641 Second degree hemorrhoids: Secondary | ICD-10-CM | POA: Insufficient documentation

## 2021-07-25 DIAGNOSIS — Z882 Allergy status to sulfonamides status: Secondary | ICD-10-CM | POA: Diagnosis not present

## 2021-07-25 DIAGNOSIS — D123 Benign neoplasm of transverse colon: Secondary | ICD-10-CM | POA: Insufficient documentation

## 2021-07-25 DIAGNOSIS — D122 Benign neoplasm of ascending colon: Secondary | ICD-10-CM | POA: Diagnosis not present

## 2021-07-25 DIAGNOSIS — Z886 Allergy status to analgesic agent status: Secondary | ICD-10-CM | POA: Diagnosis not present

## 2021-07-25 HISTORY — DX: Presence of dental prosthetic device (complete) (partial): Z97.2

## 2021-07-25 HISTORY — PX: POLYPECTOMY: SHX5525

## 2021-07-25 HISTORY — PX: COLONOSCOPY WITH PROPOFOL: SHX5780

## 2021-07-25 SURGERY — COLONOSCOPY WITH PROPOFOL
Anesthesia: General

## 2021-07-25 MED ORDER — ACETAMINOPHEN 10 MG/ML IV SOLN: 1000.0000 mg | Freq: Once | INTRAVENOUS | Status: DC | PRN

## 2021-07-25 MED ORDER — LIDOCAINE HCL (CARDIAC) PF 100 MG/5ML IV SOSY
PREFILLED_SYRINGE | INTRAVENOUS | Status: DC | PRN
  Administered 2021-07-25: 60 mg via INTRAVENOUS

## 2021-07-25 MED ORDER — ONDANSETRON HCL 4 MG/2ML IJ SOLN: 4.0000 mg | Freq: Once | INTRAMUSCULAR | Status: DC | PRN

## 2021-07-25 MED ORDER — SODIUM CHLORIDE 0.9 % IV SOLN: INTRAVENOUS | Status: DC

## 2021-07-25 MED ORDER — PROPOFOL 10 MG/ML IV BOLUS
INTRAVENOUS | Status: DC | PRN
  Administered 2021-07-25: 30 mg via INTRAVENOUS
  Administered 2021-07-25: 70 mg via INTRAVENOUS
  Administered 2021-07-25 (×4): 30 mg via INTRAVENOUS

## 2021-07-25 MED ORDER — LACTATED RINGERS IV SOLN: INTRAVENOUS | Status: DC

## 2021-07-25 SURGICAL SUPPLY — 22 items
CLIP HMST 235XBRD CATH ROT (MISCELLANEOUS) IMPLANT
CLIP RESOLUTION 360 11X235 (MISCELLANEOUS)
ELECT REM PT RETURN 9FT ADLT (ELECTROSURGICAL)
ELECTRODE REM PT RTRN 9FT ADLT (ELECTROSURGICAL) IMPLANT
FORCEPS BIOP RAD 4 LRG CAP 4 (CUTTING FORCEPS) IMPLANT
GOWN CVR UNV OPN BCK APRN NK (MISCELLANEOUS) ×4 IMPLANT
GOWN ISOL THUMB LOOP REG UNIV (MISCELLANEOUS) ×6
INJECTOR VARIJECT VIN23 (MISCELLANEOUS) IMPLANT
KIT DEFENDO VALVE AND CONN (KITS) IMPLANT
KIT PRC NS LF DISP ENDO (KITS) ×2 IMPLANT
KIT PROCEDURE OLYMPUS (KITS) ×3
MANIFOLD NEPTUNE II (INSTRUMENTS) ×3 IMPLANT
MARKER SPOT ENDO TATTOO 5ML (MISCELLANEOUS) IMPLANT
PROBE APC STR FIRE (PROBE) IMPLANT
RETRIEVER NET ROTH 2.5X230 LF (MISCELLANEOUS) IMPLANT
SNARE COLD EXACTO (MISCELLANEOUS) ×3 IMPLANT
SNARE SHORT THROW 13M SML OVAL (MISCELLANEOUS) IMPLANT
SNARE SNG USE RND 15MM (INSTRUMENTS) IMPLANT
SPOT EX ENDOSCOPIC TATTOO (MISCELLANEOUS)
TRAP ETRAP POLY (MISCELLANEOUS) ×3 IMPLANT
VARIJECT INJECTOR VIN23 (MISCELLANEOUS)
WATER STERILE IRR 250ML POUR (IV SOLUTION) ×3 IMPLANT

## 2021-07-25 NOTE — Anesthesia Procedure Notes (Signed)
Date/Time: 07/25/2021 11:18 AM Performed by: Dionne Bucy, CRNA Pre-anesthesia Checklist: Patient identified, Emergency Drugs available, Suction available, Patient being monitored and Timeout performed Patient Re-evaluated:Patient Re-evaluated prior to induction Oxygen Delivery Method: Nasal cannula Induction Type: IV induction Placement Confirmation: positive ETCO2

## 2021-07-25 NOTE — H&P (Signed)
Midge Minium, MD [facility] [address], [address] [place], [place] [address] Phone: [phone] Fax : [phone]  Primary Care Physician:  Lorre Munroe, NP Primary Gastroenterologist:  Dr. Servando Snare  Pre-Procedure History & Physical: HPI:  Mallory Deleon is a 45 y.o. female is here for a screening colonoscopy.   Past Medical History:  Diagnosis Date   Kidney stones 2010   Thyroid disease    Wears dentures    partial upper    Past Surgical History:  Procedure Laterality Date   ABDOMINAL HYSTERECTOMY     LITHOTRIPSY Bilateral    SPLENECTOMY     TUBAL LIGATION      Prior to Admission medications   Medication Sig Start Date End Date Taking? Authorizing Provider  b complex vitamins tablet Take by mouth.   Yes [provider]  folic acid (FOLVITE) 1 MG tablet Take by mouth.   Yes [provider]  ibuprofen (ADVIL,MOTRIN) 200 MG tablet Take 400 mg by mouth every 4 (four) hours as needed.   Yes [provider]  Lactobacillus (ACIDOPHILUS) CAPS capsule Take by mouth.   Yes [provider]  Magnesium Gluconate 550 MG TABS Take by mouth.   Yes [provider]  metroNIDAZOLE (METROGEL VAGINAL) 0.75 % vaginal gel Place 1 Applicatorful vaginally 2 (two) times daily. 06/25/21  Yes Baity, Salvadore Oxford, NP  Na Sulfate-K Sulfate-Mg Sulf (SUPREP BOWEL PREP KIT) 17.5-3.13-1.6 GM/177ML SOLN Take 1 kit by mouth as directed. 06/30/21  Yes Midge Minium, MD  Potassium 99 MG TABS Take by mouth.   Yes [provider]  vitamin C (ASCORBIC ACID) 500 MG tablet Take by mouth.   Yes [provider]  EUTHYROX 25 MCG tablet Take 1 tablet by mouth once daily Patient not taking: Reported on 06/20/2021 04/19/20   Smitty Cords, DO    Allergies as of 06/30/2021 - Review Complete 06/20/2021  Allergen Reaction Noted   Naproxen sodium Itching and Rash 03/12/2017   Sulfa antibiotics Hives and Rash 09/11/2014    Family History  Problem Relation Age  of Onset   Diabetes Mother    Diabetes Father    Heart disease Father        CABGx3   Thyroid disease Sister        hypothyroid   Hereditary spherocytosis Daughter    Hereditary spherocytosis Son     Social History   Socioeconomic History   Marital status: Single    Spouse name: Not on file   Number of children: 3   Years of education: Not on file   Highest education level: Not on file  Occupational History   Not on file  Tobacco Use   Smoking status: Every Day    Packs/day: 0.50    Years: 25.00    Pack years: 12.50    Types: Cigarettes   Smokeless tobacco: Never   Tobacco comments:    smokes when she is bored and stressed  Vaping Use   Vaping Use: Every day  Substance and Sexual Activity   Alcohol use: No    Comment: rarely   Drug use: Yes    Frequency: 6.0 times per week    Types: Marijuana   Sexual activity: Yes  Other Topics Concern   Not on file  Social History Narrative   Not on file   Social Determinants of Health   Financial Resource Strain: Not on file  Food Insecurity: Not on file  Transportation Needs: Not on file  Physical Activity: Not on file  Stress: Not on file  Social Connections: Not on file  Intimate Partner Violence: Not on file    Review of Systems: See HPI, otherwise negative ROS  Physical Exam: BP 133/78   Pulse 76   Temp 97.7 F (36.5 C) (Temporal)   Resp 20   Ht $R'4\' 11"'Ym$  (1.499 m)   Wt 72.6 kg   SpO2 96%   BMI 32.32 kg/m  General:   Alert,  pleasant and cooperative in NAD Head:  Normocephalic and atraumatic. Neck:  Supple; no masses or thyromegaly. Lungs:  Clear throughout to auscultation.    Heart:  Regular rate and rhythm. Abdomen:  Soft, nontender and nondistended. Normal bowel sounds, without guarding, and without rebound.   Neurologic:  Alert and  oriented x4;  grossly normal neurologically.  Impression/Plan: Mallory Deleon is now here to undergo a screening colonoscopy.  Risks, benefits, and alternatives  regarding colonoscopy have been reviewed with the patient.  Questions have been answered.  All parties agreeable.

## 2021-07-25 NOTE — Op Note (Signed)
[facility] Gastroenterology Patient Name: Mallory Deleon Procedure Date: 07/25/2021 11:10 AM MRN: [id number] Account #: [id number] Date of Birth: Apr 21, 1976 Admit Type: Outpatient Age: 45 Room: [facility] OR ROOM 01 Gender: Female Note Status: Finalized Instrument Name: [id number] Procedure:             Colonoscopy Indications:           Screening for colorectal malignant neoplasm Providers:             Lucilla Lame MD, MD Referring MD:          Jearld Fenton (Referring MD) Medicines:             Propofol per Anesthesia Complications:         No immediate complications. Procedure:             Pre-Anesthesia Assessment:                        - Prior to the procedure, a History and Physical was                         performed, and patient medications and allergies were                         reviewed. The patient's tolerance of previous                         anesthesia was also reviewed. The risks and benefits                         of the procedure and the sedation options and risks                         were discussed with the patient. All questions were                         answered, and informed consent was obtained. Prior                         Anticoagulants: The patient has taken no previous                         anticoagulant or antiplatelet agents. ASA Grade                         Assessment: II - A patient with mild systemic disease.                         After reviewing the risks and benefits, the patient                         was deemed in satisfactory condition to undergo the                         procedure.                        After obtaining informed consent, the colonoscope was  passed under direct vision. Throughout the procedure,                         the patient's blood pressure, pulse, and oxygen                         saturations were monitored continuously. The                         Colonoscope was  introduced through the anus and                         advanced to the the cecum, identified by appendiceal                         orifice and ileocecal valve. The colonoscopy was                         performed without difficulty. The patient tolerated                         the procedure well. The quality of the bowel                         preparation was excellent. Findings:      The perianal and digital rectal examinations were normal.      A 4 mm polyp was found in the ascending colon. The polyp was sessile.       The polyp was removed with a cold snare. Resection and retrieval were       complete.      Three sessile polyps were found in the transverse colon. The polyps were       4 to 5 mm in size. These polyps were removed with a cold snare.       Resection and retrieval were complete.      Two sessile polyps were found in the sigmoid colon. The polyps were 3 to       4 mm in size. These polyps were removed with a cold snare. Resection and       retrieval were complete.      Non-bleeding internal hemorrhoids were found during retroflexion. The       hemorrhoids were Grade II (internal hemorrhoids that prolapse but reduce       spontaneously). Impression:            - One 4 mm polyp in the ascending colon, removed with                         a cold snare. Resected and retrieved.                        - Three 4 to 5 mm polyps in the transverse colon,                         removed with a cold snare. Resected and retrieved.                        - Two 3 to 4 mm polyps in the sigmoid colon, removed  with a cold snare. Resected and retrieved.                        - Non-bleeding internal hemorrhoids. Recommendation:        - Discharge patient to home.                        - Resume previous diet.                        - Continue present medications.                        - Await pathology results. Procedure Code(s):     --- Professional ---                         [phone], Colonoscopy, flexible; with removal of                         tumor(s), polyp(s), or other lesion(s) by snare                         technique Diagnosis Code(s):     --- Professional ---                        Z12.11, Encounter for screening for malignant neoplasm                         of colon                        K63.5, Polyp of colon CPT copyright 2019 American Medical Association. All rights reserved. The codes documented in this report are preliminary and upon coder review may  be revised to meet current compliance requirements. Lucilla Lame MD, MD 07/25/2021 11:39:50 AM This report has been signed electronically. Number of Addenda: 0 Note Initiated On: 07/25/2021 11:10 AM Scope Withdrawal Time: 0 hours 11 minutes 18 seconds  Total Procedure Duration: 0 hours 13 minutes 55 seconds  Estimated Blood Loss:  Estimated blood loss: none.      [facility]

## 2021-07-25 NOTE — Anesthesia Preprocedure Evaluation (Signed)
Anesthesia Evaluation  Patient identified by MRN, date of birth, ID band Patient awake    Reviewed: Allergy & Precautions, NPO status , Patient's Chart, lab work & pertinent test results, reviewed documented beta blocker date and time   History of Anesthesia Complications Negative for: history of anesthetic complications  Airway Mallampati: III  TM Distance: >3 FB Neck ROM: Full    Dental  (+) Partial Upper,    Pulmonary Current Smoker (Cig x5 this AM)Patient did not abstain from smoking.,    breath sounds clear to auscultation       Cardiovascular (-) angina(-) DOE  Rhythm:Regular Rate:Normal   HLD   Neuro/Psych    GI/Hepatic neg GERD  ,  Endo/Other  Hypothyroidism (Sub-clinical, not currently on meds)   Renal/GU Renal disease (Stones)     Musculoskeletal   Abdominal (+) + obese (BMI 32),   Peds  Hematology   Anesthesia Other Findings   Reproductive/Obstetrics                             Anesthesia Physical Anesthesia Plan  ASA: 2  Anesthesia Plan: General   Post-op Pain Management:    Induction: Intravenous  PONV Risk Score and Plan: 2 and Propofol infusion, TIVA and Treatment may vary due to age or medical condition  Airway Management Planned: Natural Airway and Nasal Cannula  Additional Equipment:   Intra-op Plan:   Post-operative Plan:   Informed Consent: I have reviewed the patients History and Physical, chart, labs and discussed the procedure including the risks, benefits and alternatives for the proposed anesthesia with the patient or authorized representative who has indicated his/her understanding and acceptance.       Plan Discussed with: CRNA and Anesthesiologist  Anesthesia Plan Comments:         Anesthesia Quick Evaluation

## 2021-07-25 NOTE — Transfer of Care (Signed)
Immediate Anesthesia Transfer of Care Note  Patient: Mallory Deleon  Procedure(s) Performed: COLONOSCOPY WITH PROPOFOL POLYPECTOMY  Patient Location: PACU  Anesthesia Type: General  Level of Consciousness: awake, alert  and patient cooperative  Airway and Oxygen Therapy: Patient Spontanous Breathing and Patient connected to supplemental oxygen  Post-op Assessment: Post-op Vital signs reviewed, Patient's Cardiovascular Status Stable, Respiratory Function Stable, Patent Airway and No signs of Nausea or vomiting  Post-op Vital Signs: Reviewed and stable  Complications: No notable events documented.

## 2021-07-25 NOTE — Anesthesia Postprocedure Evaluation (Signed)
Anesthesia Post Note  Patient: Mallory Deleon  Procedure(s) Performed: COLONOSCOPY WITH PROPOFOL POLYPECTOMY     Patient location during evaluation: PACU Anesthesia Type: General Level of consciousness: awake and alert Pain management: pain level controlled Vital Signs Assessment: post-procedure vital signs reviewed and stable Respiratory status: spontaneous breathing, nonlabored ventilation, respiratory function stable and patient connected to nasal cannula oxygen Cardiovascular status: blood pressure returned to baseline and stable Postop Assessment: no apparent nausea or vomiting Anesthetic complications: no   No notable events documented.  Shereda Graw A  Gram Siedlecki

## 2021-07-28 ENCOUNTER — Encounter: Payer: Self-pay | Admitting: Gastroenterology

## 2021-07-28 LAB — SURGICAL PATHOLOGY

## 2021-08-08 ENCOUNTER — Telehealth: Payer: Self-pay | Admitting: Gastroenterology

## 2021-08-08 NOTE — Telephone Encounter (Signed)
Gastroenterology Pre-Procedure Review  Request Date: 07/25/21 Requesting Physician: Dr. Allen Norris   [facility]  PATIENT REVIEW QUESTIONS: The patient responded to the following health history questions as indicated:    1. Are you having any GI issues? no 2. Do you have a personal history of Polyps? no 3. Do you have a family history of Colon Cancer or Polyps? yes (Aunt - Mom's sister) 4. Diabetes Mellitus? no 5. Joint replacements in the past 12 months?no 6. Major health problems in the past 3 months?no 7. Any artificial heart valves, MVP, or defibrillator?no    MEDICATIONS & ALLERGIES:    Patient reports the following regarding taking any anticoagulation/antiplatelet therapy:   Plavix, Coumadin, Eliquis, Xarelto, Lovenox, Pradaxa, Brilinta, or Effient? no Aspirin? no  Pulmonary disease: No BMI: 33.4 [place] [facility]  Patient confirms/reports the following medications:  Current Outpatient Medications  Medication Sig Dispense Refill   b complex vitamins tablet Take by mouth.     EUTHYROX 25 MCG tablet Take 1 tablet by mouth once daily (Patient not taking: Reported on 06/20/2021) 90 tablet 0   folic acid (FOLVITE) 1 MG tablet Take by mouth.     ibuprofen (ADVIL,MOTRIN) 200 MG tablet Take 400 mg by mouth every 4 (four) hours as needed.     Lactobacillus (ACIDOPHILUS) CAPS capsule Take by mouth.     Magnesium Gluconate 550 MG TABS Take by mouth.     metroNIDAZOLE (METROGEL VAGINAL) 0.75 % vaginal gel Place 1 Applicatorful vaginally 2 (two) times daily. 70 g 0   Na Sulfate-K Sulfate-Mg Sulf (SUPREP BOWEL PREP KIT) 17.5-3.13-1.6 GM/177ML SOLN Take 1 kit by mouth as directed. 354 mL 0   Potassium 99 MG TABS Take by mouth.     vitamin C (ASCORBIC ACID) 500 MG tablet Take by mouth.     No current facility-administered medications for this visit.    Patient confirms/reports the following allergies:  Allergies  Allergen Reactions   Naproxen Sodium Itching and Rash   Sulfa  Antibiotics Hives and Rash    No orders of the defined types were placed in this encounter.   AUTHORIZATION INFORMATION Primary Insurance: 1D#: Group #:  Secondary Insurance: 1D#: Group #:  SCHEDULE INFORMATION: Date:  Time: Location:

## 2021-09-17 ENCOUNTER — Ambulatory Visit: Payer: Self-pay | Admitting: *Deleted

## 2021-09-17 ENCOUNTER — Telehealth: Payer: Managed Care, Other (non HMO) | Admitting: Physician Assistant

## 2021-09-17 DIAGNOSIS — B9689 Other specified bacterial agents as the cause of diseases classified elsewhere: Secondary | ICD-10-CM | POA: Diagnosis not present

## 2021-09-17 DIAGNOSIS — R3989 Other symptoms and signs involving the genitourinary system: Secondary | ICD-10-CM

## 2021-09-17 DIAGNOSIS — J208 Acute bronchitis due to other specified organisms: Secondary | ICD-10-CM | POA: Diagnosis not present

## 2021-09-17 MED ORDER — PREDNISONE 20 MG PO TABS: 40.0000 mg | ORAL_TABLET | Freq: Every day | ORAL | 0 refills | Status: DC

## 2021-09-17 MED ORDER — PSEUDOEPH-BROMPHEN-DM 30-2-10 MG/5ML PO SYRP: 5.0000 mL | ORAL_SOLUTION | Freq: Four times a day (QID) | ORAL | 0 refills | Status: DC | PRN

## 2021-09-17 MED ORDER — BENZONATATE 100 MG PO CAPS: 100.0000 mg | ORAL_CAPSULE | Freq: Three times a day (TID) | ORAL | 0 refills | Status: DC | PRN

## 2021-09-17 MED ORDER — AMOXICILLIN-POT CLAVULANATE 875-125 MG PO TABS
1.0000 | ORAL_TABLET | Freq: Two times a day (BID) | ORAL | 0 refills | Status: DC
Start: 2021-09-17 — End: 2021-11-25

## 2021-09-17 NOTE — Progress Notes (Signed)
Virtual Visit Consent   Mallory Deleon, you are scheduled for a virtual visit with a [facility] provider today.     Just as with appointments in the office, your consent must be obtained to participate.  Your consent will be active for this visit and any virtual visit you may have with one of our providers in the next 365 days.     If you have a MyChart account, a copy of this consent can be sent to you electronically.  All virtual visits are billed to your insurance company just like a traditional visit in the office.    As this is a virtual visit, video technology does not allow for your provider to perform a traditional examination.  This may limit your provider's ability to fully assess your condition.  If your provider identifies any concerns that need to be evaluated in person or the need to arrange testing (such as labs, EKG, etc.), we will make arrangements to do so.     Although advances in technology are sophisticated, we cannot ensure that it will always work on either your end or our end.  If the connection with a video visit is poor, the visit may have to be switched to a telephone visit.  With either a video or telephone visit, we are not always able to ensure that we have a secure connection.     I need to obtain your verbal consent now.   Are you willing to proceed with your visit today?    Mallory Deleon has provided verbal consent on 09/17/2021 for a virtual visit (video or telephone).   Mallory Daring, PA-C   Date: 09/17/2021 12:51 PM   Virtual Visit via Video Note   I, Mallory Deleon, connected with  Mallory Deleon  ([id number], February 22, 1976) on 09/17/21 at 12:30 PM EDT by a video-enabled telemedicine application and verified that I am speaking with the correct person using two identifiers.  Location: Patient: Virtual Visit Location Patient: Home Provider: Virtual Visit Location Provider: Home Office   I discussed the limitations of evaluation and management by  telemedicine and the availability of in person appointments. The patient expressed understanding and agreed to proceed.    History of Present Illness: Mallory Deleon is a 45 y.o. who identifies as a female who was assigned female at birth, and is being seen today for chest congestion.  HPI: Cough This is a new problem. The current episode started 1 to 4 weeks ago. The problem has been gradually worsening. The cough is Non-productive. Associated symptoms include chest pain (heavy), chills, ear congestion, ear pain (mild), a fever (since Monday), myalgias, nasal congestion, postnasal drip, rhinorrhea, a sore throat (mild) and shortness of breath. The symptoms are aggravated by lying down. Treatments tried: ibuprofen, diffusing oregano, onion, ginger oil, honey, garlic lemon drinks. The treatment provided no relief. Her past medical history is significant for pneumonia (once years ago). There is no history of asthma or bronchitis.   Also noticing some urinary frequency, pressure and foul smelling urine.  Problems:  Patient Active Problem List   Diagnosis Date Noted   Screen for colon cancer    Polyp of transverse colon    HLD (hyperlipidemia) 06/20/2021   Class 1 obesity due to excess calories with body mass index (BMI) of 33.0 to 33.9 in adult 06/20/2021   Hypothyroidism 06/30/2018    Allergies:  Allergies  Allergen Reactions   Naproxen Sodium Itching and Rash   Sulfa  Antibiotics Hives and Rash   Medications:  Current Outpatient Medications:    amoxicillin-clavulanate (AUGMENTIN) 875-125 MG tablet, Take 1 tablet by mouth 2 (two) times daily., Disp: 20 tablet, Rfl: 0   benzonatate (TESSALON) 100 MG capsule, Take 1 capsule (100 mg total) by mouth 3 (three) times daily as needed., Disp: 30 capsule, Rfl: 0   brompheniramine-pseudoephedrine-DM 30-2-10 MG/5ML syrup, Take 5 mLs by mouth 4 (four) times daily as needed., Disp: 120 mL, Rfl: 0   predniSONE (DELTASONE) 20 MG tablet, Take 2 tablets (40  mg total) by mouth daily with breakfast., Disp: 10 tablet, Rfl: 0   b complex vitamins tablet, Take by mouth., Disp: , Rfl:    EUTHYROX 25 MCG tablet, Take 1 tablet by mouth once daily (Patient not taking: Reported on 06/20/2021), Disp: 90 tablet, Rfl: 0   folic acid (FOLVITE) 1 MG tablet, Take by mouth., Disp: , Rfl:    ibuprofen (ADVIL,MOTRIN) 200 MG tablet, Take 400 mg by mouth every 4 (four) hours as needed., Disp: , Rfl:    Lactobacillus (ACIDOPHILUS) CAPS capsule, Take by mouth., Disp: , Rfl:    Magnesium Gluconate 550 MG TABS, Take by mouth., Disp: , Rfl:    metroNIDAZOLE (METROGEL VAGINAL) 0.75 % vaginal gel, Place 1 Applicatorful vaginally 2 (two) times daily., Disp: 70 g, Rfl: 0   Na Sulfate-K Sulfate-Mg Sulf (SUPREP BOWEL PREP KIT) 17.5-3.13-1.6 GM/177ML SOLN, Take 1 kit by mouth as directed., Disp: 354 mL, Rfl: 0   Potassium 99 MG TABS, Take by mouth., Disp: , Rfl:    vitamin C (ASCORBIC ACID) 500 MG tablet, Take by mouth., Disp: , Rfl:   Observations/Objective: Patient is well-developed, well-nourished in no acute distress.  Appears ill Resting comfortably at home.  Head is normocephalic, atraumatic.  No labored breathing.  Speech is clear and coherent with logical content.  Patient is alert and oriented at baseline.    Assessment and Plan: 1. Acute bacterial bronchitis - amoxicillin-clavulanate (AUGMENTIN) 875-125 MG tablet; Take 1 tablet by mouth 2 (two) times daily.  Dispense: 20 tablet; Refill: 0 - benzonatate (TESSALON) 100 MG capsule; Take 1 capsule (100 mg total) by mouth 3 (three) times daily as needed.  Dispense: 30 capsule; Refill: 0 - brompheniramine-pseudoephedrine-DM 30-2-10 MG/5ML syrup; Take 5 mLs by mouth 4 (four) times daily as needed.  Dispense: 120 mL; Refill: 0 - predniSONE (DELTASONE) 20 MG tablet; Take 2 tablets (40 mg total) by mouth daily with breakfast.  Dispense: 10 tablet; Refill: 0  2. Suspected UTI - amoxicillin-clavulanate (AUGMENTIN) 875-125 MG  tablet; Take 1 tablet by mouth 2 (two) times daily.  Dispense: 20 tablet; Refill: 0  - Suspect Bronchitis and UTI - Treat with Augmentin - Tessalon perles and bromfed dm for cough - Prednisone for bronchitis - Push fluids - Rest - Seek in person evaluation if symptoms worsen or fail to improve  Follow Up Instructions: I discussed the assessment and treatment plan with the patient. The patient was provided an opportunity to ask questions and all were answered. The patient agreed with the plan and demonstrated an understanding of the instructions.  A copy of instructions were sent to the patient via MyChart unless otherwise noted below.    The patient was advised to call back or seek an in-person evaluation if the symptoms worsen or if the condition fails to improve as anticipated.  Time:  I spent 15 minutes with the patient via telehealth technology discussing the above problems/concerns.    Mallory Daring, PA-C

## 2021-09-17 NOTE — Patient Instructions (Signed)
Mallory Deleon, thank you for joining [name], PA-C for today's virtual visit.  While this provider is not your primary care provider (PCP), if your PCP is located in our provider database this encounter information will be shared with them immediately following your visit.  Consent: (Patient) Mallory Deleon provided verbal consent for this virtual visit at the beginning of the encounter.  Current Medications:  Current Outpatient Medications:    amoxicillin-clavulanate (AUGMENTIN) 875-125 MG tablet, Take 1 tablet by mouth 2 (two) times daily., Disp: 20 tablet, Rfl: 0   benzonatate (TESSALON) 100 MG capsule, Take 1 capsule (100 mg total) by mouth 3 (three) times daily as needed., Disp: 30 capsule, Rfl: 0   brompheniramine-pseudoephedrine-DM 30-2-10 MG/5ML syrup, Take 5 mLs by mouth 4 (four) times daily as needed., Disp: 120 mL, Rfl: 0   predniSONE (DELTASONE) 20 MG tablet, Take 2 tablets (40 mg total) by mouth daily with breakfast., Disp: 10 tablet, Rfl: 0   b complex vitamins tablet, Take by mouth., Disp: , Rfl:    EUTHYROX 25 MCG tablet, Take 1 tablet by mouth once daily (Patient not taking: Reported on 06/20/2021), Disp: 90 tablet, Rfl: 0   folic acid (FOLVITE) 1 MG tablet, Take by mouth., Disp: , Rfl:    ibuprofen (ADVIL,MOTRIN) 200 MG tablet, Take 400 mg by mouth every 4 (four) hours as needed., Disp: , Rfl:    Lactobacillus (ACIDOPHILUS) CAPS capsule, Take by mouth., Disp: , Rfl:    Magnesium Gluconate 550 MG TABS, Take by mouth., Disp: , Rfl:    metroNIDAZOLE (METROGEL VAGINAL) 0.75 % vaginal gel, Place 1 Applicatorful vaginally 2 (two) times daily., Disp: 70 g, Rfl: 0   Na Sulfate-K Sulfate-Mg Sulf (SUPREP BOWEL PREP KIT) 17.5-3.13-1.6 GM/177ML SOLN, Take 1 kit by mouth as directed., Disp: 354 mL, Rfl: 0   Potassium 99 MG TABS, Take by mouth., Disp: , Rfl:    vitamin C (ASCORBIC ACID) 500 MG tablet, Take by mouth., Disp: , Rfl:    Medications ordered in this encounter:   Meds ordered this encounter  Medications   amoxicillin-clavulanate (AUGMENTIN) 875-125 MG tablet    Sig: Take 1 tablet by mouth 2 (two) times daily.    Dispense:  20 tablet    Refill:  0    Order Specific Question:   Supervising Provider    Answer:   MILLER, BRIAN [[id number]]   benzonatate (TESSALON) 100 MG capsule    Sig: Take 1 capsule (100 mg total) by mouth 3 (three) times daily as needed.    Dispense:  30 capsule    Refill:  0    Order Specific Question:   Supervising Provider    Answer:   Sabra Heck, BRIAN [[id number]]   brompheniramine-pseudoephedrine-DM 30-2-10 MG/5ML syrup    Sig: Take 5 mLs by mouth 4 (four) times daily as needed.    Dispense:  120 mL    Refill:  0    Order Specific Question:   Supervising Provider    Answer:   MILLER, BRIAN [[id number]]   predniSONE (DELTASONE) 20 MG tablet    Sig: Take 2 tablets (40 mg total) by mouth daily with breakfast.    Dispense:  10 tablet    Refill:  0    Order Specific Question:   Supervising Provider    Answer:   Noemi Chapel [[id number]]     *If you need refills on other medications prior to your next appointment, please contact your pharmacy*  Follow-Up: Call back or seek  an in-person evaluation if the symptoms worsen or if the condition fails to improve as anticipated.  Other Instructions Acute Bronchitis, Adult Acute bronchitis is sudden or acute swelling of the air tubes (bronchi) in the lungs. Acute bronchitis causes these tubes to fill with mucus, which can make it hard to breathe. It can also cause coughing or wheezing. In adults, acute bronchitis usually goes away within 2 weeks. A cough caused by bronchitis may last up to 3 weeks. Smoking, allergies, and asthma can make the condition worse. What are the causes? This condition can be caused by germs and by substances that irritate the lungs, including: Cold and flu viruses. The most common cause of this condition is the virus that causes the common cold. Bacteria. Substances that  irritate the lungs, including: Smoke from cigarettes and other forms of tobacco. Dust and pollen. Fumes from chemical products, gases, or burned fuel. Other materials that pollute indoor or outdoor air. Close contact with someone who has acute bronchitis. What increases the risk? The following factors may make you more likely to develop this condition: A weak body's defense system, also called the immune system. A condition that affects your lungs and breathing, such as asthma. What are the signs or symptoms? Common symptoms of this condition include: Lung and breathing problems, such as: Coughing. This may bring up clear, yellow, or green mucus from your lungs (sputum). Wheezing. Having too much mucus in your lungs (chest congestion). Having shortness of breath. A fever. Chills. Aches and pains, including: Tightness in your chest and other body aches. A sore throat. How is this diagnosed? This condition is usually diagnosed based on: Your symptoms and medical history. A physical exam. You may also have other tests, including tests to rule out other conditions, such as pneumonia. These tests include: A test of lung function. Test of a mucus sample to look for the presence of bacteria. Tests to check the oxygen level in your blood. Blood tests. Chest X-ray. How is this treated? Most cases of acute bronchitis clear up over time without treatment. Your health care provider may recommend: Drinking more fluids. This can thin your mucus, which may improve your breathing. Using a device that gets medicine into your lungs (inhaler) to help improve breathing and control coughing. Using a vaporizer or a humidifier. These are machines that add water to the air to help you breathe better. Taking a medicine for a fever. Taking a medicine that thins mucus and clears congestion (expectorant). Taking a medicine that prevents or stops coughing (cough suppressant). Follow these instructions at  home: Activity Get plenty of rest. Return to your normal activities as told by your health care provider. Ask your health care provider what activities are safe for you. Lifestyle  Drink enough fluid to keep your urine pale yellow. Do not drink alcohol. Do not use any products that contain nicotine or tobacco, such as cigarettes, e-cigarettes, and chewing tobacco. If you need help quitting, ask your health care provider. Be aware that: Your bronchitis will get worse if you smoke or breathe in other people's smoke (secondhand smoke). Your lungs will heal faster if you quit smoking. General instructions Take over-the-counter and prescription medicines only as told by your health care provider. Use an inhaler, vaporizer, or humidifier as told by your health care provider. If you have a sore throat, gargle with a salt-water mixture 3-4 times a day or as needed. To make a salt-water mixture, completely dissolve -1 tsp (3-6 g) of salt  in 1 cup (237 mL) of warm water. Take two teaspoons of honey at bedtime to lessen coughing at night. Keep all follow-up visits as told by your health care provider. This is important. How is this prevented? To lower your risk of getting this condition again: Wash your hands often with soap and water. If soap and water are not available, use hand sanitizer. Avoid contact with people who have cold symptoms. Try not to touch your mouth, nose, or eyes with your hands. Avoid places where there are fumes from chemicals. Breathing these fumes will make your condition worse. Get the flu shot every year. Contact a health care provider if: Your symptoms do not improve after 2 weeks of treatment. You vomit more than once or twice. You have symptoms of dehydration such as: Dark urine. Dry skin or eyes. Increased thirst. Headaches. Confusion. Muscle cramps. Get help right away if you: Cough up blood. Feel pain in your chest. Have severe shortness of breath. Faint or  keep feeling like you are going to faint. Have a severe headache. Have fever or chills that get worse. These symptoms may represent a serious problem that is an emergency. Do not wait to see if the symptoms will go away. Get medical help right away. Call your local emergency services (911 in the [place] Do not drive yourself to the hospital. Summary Acute bronchitis is sudden (acute) inflammation of the air tubes (bronchi) between the windpipe and the lungs. In adults, acute bronchitis usually goes away within 2 weeks, although coughing may last 3 weeks or longer. Take over-the-counter and prescription medicines only as told by your health care provider. Drink enough fluid to keep your urine pale yellow. Contact a health care provider if your symptoms do not improve after 2 weeks of treatment. Get help right away if you cough up blood, faint, or have chest pain or shortness of breath. This information is not intended to replace advice given to you by your health care provider. Make sure you discuss any questions you have with your health care provider. Document Revised: 10/02/2020 Document Reviewed: 05/26/2019 Elsevier Patient Education  2022 [employer].    If you have been instructed to have an in-person evaluation today at a local Urgent Care facility, please use the link below. It will take you to a list of all of our available [facility], including address, phone number and hours of operation. Please do not delay care.  [facility]  If you or a family member do not have a primary care provider, use the link below to schedule a visit and establish care. When you choose a  primary care physician or advanced practice provider, you gain a long-term partner in health. Find a Primary Care Provider  Learn more about 's in-office and virtual care options: [facility]

## 2021-09-17 NOTE — Telephone Encounter (Signed)
Reason for Disposition  Fever present > 3 days (72 hours)  Answer Assessment - Initial Assessment Questions 1. TEMPERATURE: "What is the most recent temperature?"  "How was it measured?"      100.2 oral temp 2. ONSET: "When did the fever start?"      Monday evening 3. CHILLS: "Do you have chills?" If yes: "How bad are they?"  (e.g., none, mild, moderate, severe)   - NONE: no chills   - MILD: feeling cold   - MODERATE: feeling very cold, some shivering (feels better under a thick blanket)   - SEVERE: feeling extremely cold with shaking chills (general body shaking, rigors; even under a thick blanket)      moderate 4. OTHER SYMPTOMS: "Do you have any other symptoms besides the fever?"  (e.g., abdomen pain, cough, diarrhea, earache, headache, sore throat, urination pain)     Cough, headache with coughing 5. CAUSE: If there are no symptoms, ask: "What do you think is causing the fever?"      unknown 6. CONTACTS: "Does anyone else in the family have an infection?"     no 7. TREATMENT: "What have you done so far to treat this fever?" (e.g., medications)     nothing 8. IMMUNOCOMPROMISE: "Do you have of the following: diabetes, HIV positive, splenectomy, cancer chemotherapy, chronic steroid treatment, transplant patient, etc."     no 9. PREGNANCY: "Is there any chance you are pregnant?" "When was your last menstrual period?"     no 10. TRAVEL: "Have you traveled out of the country in the last month?" (e.g., travel history, exposures)       no  Protocols used: Fever-A-AH

## 2021-09-17 NOTE — Telephone Encounter (Addendum)
Patient with upper respiratory symptoms including slightly productive cough, fever, with chest heaviness along with urinary pressure and increased frequency voiding. Has lower back pain. Temperature today 100.2 oral. No availability today at clinic-recommended [email or web address] virtual visit to be seen today. Noted from patient's chart she has checked in for 12:30p virtual appointment already.  TC to patient to confirm she will be doing the virtual visit today with [email or web address].

## 2021-11-20 ENCOUNTER — Emergency Department
Admission: EM | Admit: 2021-11-20 | Discharge: 2021-11-20 | Disposition: A | Discharge status: Home / Self Care | Payer: 59 | Attending: Emergency Medicine | Admitting: Emergency Medicine

## 2021-11-20 ENCOUNTER — Emergency Department: Payer: 59

## 2021-11-20 ENCOUNTER — Other Ambulatory Visit: Payer: Self-pay

## 2021-11-20 DIAGNOSIS — N132 Hydronephrosis with renal and ureteral calculous obstruction: Secondary | ICD-10-CM | POA: Diagnosis not present

## 2021-11-20 DIAGNOSIS — E039 Hypothyroidism, unspecified: Secondary | ICD-10-CM | POA: Insufficient documentation

## 2021-11-20 DIAGNOSIS — R1011 Right upper quadrant pain: Secondary | ICD-10-CM | POA: Diagnosis present

## 2021-11-20 DIAGNOSIS — N200 Calculus of kidney: Secondary | ICD-10-CM

## 2021-11-20 LAB — URINALYSIS, COMPLETE (UACMP) WITH MICROSCOPIC
Bilirubin Urine: NEGATIVE
Glucose, UA: NEGATIVE mg/dL
Ketones, ur: 5 mg/dL — AB
Nitrite: POSITIVE — AB
Protein, ur: NEGATIVE mg/dL
Specific Gravity, Urine: >1.046 — ABNORMAL HIGH (ref 1.005–1.030)
pH: 6 (ref 5.0–8.0)

## 2021-11-20 LAB — BASIC METABOLIC PANEL
Anion gap: 9 (ref 5–15)
BUN: 11 mg/dL (ref 6–20)
CO2: 27 mmol/L (ref 22–32)
Calcium: 9.2 mg/dL (ref 8.9–10.3)
Chloride: 102 mmol/L (ref 98–111)
Creatinine, Ser: 0.52 mg/dL (ref 0.44–1.00)
GFR, Estimated: >60 mL/min (ref >60)
Glucose, Bld: 114 mg/dL — ABNORMAL HIGH (ref 70–99)
Potassium: 4.1 mmol/L (ref 3.5–5.1)
Sodium: 138 mmol/L (ref 135–145)

## 2021-11-20 LAB — CBC
HCT: 42.1 % (ref 36.0–46.0)
Hemoglobin: 14.6 g/dL (ref 12.0–15.0)
MCH: 30.8 pg (ref 26.0–34.0)
MCHC: 34.7 g/dL (ref 30.0–36.0)
MCV: 88.8 fL (ref 80.0–100.0)
Platelets: 588 10*3/uL — ABNORMAL HIGH (ref 150–400)
RBC: 4.74 MIL/uL (ref 3.87–5.11)
RDW: 13 % (ref 11.5–15.5)
WBC: 13.3 10*3/uL — ABNORMAL HIGH (ref 4.0–10.5)
nRBC: 0 % (ref 0.0–0.2)

## 2021-11-20 LAB — URINALYSIS, ROUTINE W REFLEX MICROSCOPIC
Bilirubin Urine: NEGATIVE
Glucose, UA: NEGATIVE mg/dL
Ketones, ur: NEGATIVE mg/dL
Nitrite: NEGATIVE
Protein, ur: NEGATIVE mg/dL
Specific Gravity, Urine: 1.006 (ref 1.005–1.030)
pH: 6 (ref 5.0–8.0)

## 2021-11-20 MED ORDER — IOHEXOL 300 MG/ML  SOLN
100.0000 mL | Freq: Once | INTRAMUSCULAR | Status: AC | PRN
Start: 2021-11-20 — End: 2021-11-20
  Administered 2021-11-20: 100 mL via INTRAVENOUS
  Filled 2021-11-20: qty 100

## 2021-11-20 MED ORDER — IBUPROFEN 400 MG PO TABS: 400.0000 mg | ORAL_TABLET | Freq: Four times a day (QID) | ORAL | 0 refills | Status: DC | PRN

## 2021-11-20 MED ORDER — OXYCODONE-ACETAMINOPHEN 5-325 MG PO TABS
1.0000 | ORAL_TABLET | ORAL | Status: AC | PRN
  Administered 2021-11-20 (×2): 1 via ORAL
  Filled 2021-11-20 (×2): qty 1

## 2021-11-20 MED ORDER — ONDANSETRON HCL 4 MG/2ML IJ SOLN
4.0000 mg | Freq: Once | INTRAMUSCULAR | Status: AC
  Administered 2021-11-20: 4 mg via INTRAVENOUS
  Filled 2021-11-20: qty 2

## 2021-11-20 MED ORDER — CEPHALEXIN 500 MG PO CAPS: 500.0000 mg | ORAL_CAPSULE | Freq: Four times a day (QID) | ORAL | 0 refills | Status: DC

## 2021-11-20 MED ORDER — IBUPROFEN 400 MG PO TABS: 400.0000 mg | ORAL_TABLET | Freq: Once | ORAL | Status: DC

## 2021-11-20 MED ORDER — MORPHINE SULFATE (PF) 4 MG/ML IV SOLN
4.0000 mg | Freq: Once | INTRAVENOUS | Status: AC
  Administered 2021-11-20: 4 mg via INTRAVENOUS
  Filled 2021-11-20: qty 1

## 2021-11-20 MED ORDER — OXYCODONE HCL 5 MG PO TABS: 5.0000 mg | ORAL_TABLET | Freq: Three times a day (TID) | ORAL | 0 refills | Status: AC | PRN

## 2021-11-20 MED ORDER — CEPHALEXIN 500 MG PO CAPS
500.0000 mg | ORAL_CAPSULE | Freq: Once | ORAL | Status: AC
  Administered 2021-11-20: 500 mg via ORAL
  Filled 2021-11-20: qty 1

## 2021-11-20 MED ORDER — KETOROLAC TROMETHAMINE 15 MG/ML IJ SOLN
15.0000 mg | Freq: Once | INTRAMUSCULAR | Status: AC
  Administered 2021-11-20: 15 mg via INTRAVENOUS
  Filled 2021-11-20: qty 1

## 2021-11-20 NOTE — Discharge Instructions (Signed)
Please follow-up with Dr. Diamantina Providence with Urology.  Please take the ibuprofen around-the-clock for pain.  You can take oxycodone for breakthrough pain.  Please also take the Keflex every 6 hours.  Reasons to return to the emergency department would be if you develop fever your pain is not controlled or you are having nausea vomiting or unable to eat or drink.

## 2021-11-20 NOTE — ED Triage Notes (Addendum)
Pt to ER via POV with complaints of right sided flank pain that radiates into right side of groin/ abdomen. Reports symptoms started today after using the bathroom. Reports extensive history of kidney stones. Has recently passed several stones. Denies burning with urination, states she has noticed a foul odor to her urine.

## 2021-11-20 NOTE — ED Notes (Signed)
Lab called to add on urine culture and urinalysis.

## 2021-11-20 NOTE — ED Provider Notes (Signed)
[facility] Provider Note    Event Date/Time   First MD Initiated Contact with Patient 11/20/21 1410     (approximate)   History   Flank Pain   HPI  Mallory Deleon is a 46 y.o. female  with pmh kidney stones presents with flank pain/abdominal pain.  Symptoms started acutely today.  She endorses pain in the right flank radiating around to the right abdomen.  She has had some dysuria no hematuria.  No fevers chills.  Several episodes of nausea vomiting.  Patient has history of kidney stones but says that this is more severe than any kidney stone pain she had before.     Past Medical History:  Diagnosis Date   Kidney stones 2010   Thyroid disease    Wears dentures    partial upper    Patient Active Problem List   Diagnosis Date Noted   Screen for colon cancer    Polyp of transverse colon    HLD (hyperlipidemia) 06/20/2021   Class 1 obesity due to excess calories with body mass index (BMI) of 33.0 to 33.9 in adult 06/20/2021   Hypothyroidism 06/30/2018     Physical Exam  Triage Vital Signs: ED Triage Vitals  Enc Vitals Group     BP 11/20/21 1150 (!) 149/92     Pulse Rate 11/20/21 1150 78     Resp 11/20/21 1150 18     Temp 11/20/21 1150 98.2 F (36.8 C)     Temp Source 11/20/21 1150 Oral     SpO2 11/20/21 1150 97 %     Weight 11/20/21 1149 158 lb (71.7 kg)     Height 11/20/21 1149 4\' 11"  (1.499 m)     Head Circumference --      Peak Flow --      Pain Score 11/20/21 1149 10     Pain Loc --      Pain Edu? --      Excl. in [place]? --     Most recent vital signs: Vitals:   11/20/21 1150 11/20/21 1550  BP: (!) 149/92 135/88  Pulse: 78 65  Resp: 18 18  Temp: 98.2 F (36.8 C) 97.9 F (36.6 C)  SpO2: 97% 99%     General: Awake, patient appears uncomfortable, tearful CV:  Good peripheral perfusion.  Resp:  Normal effort.  Abd:  No distention.  Tenderness to palpation in the right upper and right lower quadrant Neuro:              Awake, Alert, Oriented x 3  Other:  Right CVA tenderness   ED Results / Procedures / Treatments  Labs (all labs ordered are listed, but only abnormal results are displayed) Labs Reviewed  URINALYSIS, ROUTINE W REFLEX MICROSCOPIC - Abnormal; Notable for the following components:      Result Value   Color, Urine YELLOW (*)    APPearance HAZY (*)    Hgb urine dipstick LARGE (*)    Leukocytes,Ua LARGE (*)    Bacteria, UA MANY (*)    All other components within normal limits  CBC - Abnormal; Notable for the following components:   WBC 13.3 (*)    Platelets 588 (*)    All other components within normal limits  BASIC METABOLIC PANEL - Abnormal; Notable for the following components:   Glucose, Bld 114 (*)    All other components within normal limits  URINALYSIS, COMPLETE (UACMP) WITH MICROSCOPIC - Abnormal; Notable for the following components:  Color, Urine YELLOW (*)    APPearance HAZY (*)    Specific Gravity, Urine >1.046 (*)    Hgb urine dipstick MODERATE (*)    Ketones, ur 5 (*)    Nitrite POSITIVE (*)    Leukocytes,Ua MODERATE (*)    Bacteria, UA RARE (*)    All other components within normal limits  URINE CULTURE     EKG     RADIOLOGY I reviewed the CT of the abdomen pelvis with contrast which shows a large proximal stone in the right ureter   PROCEDURES:    MEDICATIONS ORDERED IN ED: Medications  ibuprofen (ADVIL) tablet 400 mg (0 mg Oral Hold 11/20/21 1817)  cephALEXin (KEFLEX) capsule 500 mg (has no administration in time range)  oxyCODONE-acetaminophen (PERCOCET/ROXICET) 5-325 MG per tablet 1 tablet (1 tablet Oral Given 11/20/21 1747)  ketorolac (TORADOL) 15 MG/ML injection 15 mg (15 mg Intravenous Given 11/20/21 1537)  morphine 4 MG/ML injection 4 mg (4 mg Intravenous Given 11/20/21 1537)  ondansetron (ZOFRAN) injection 4 mg (4 mg Intravenous Given 11/20/21 1536)  iohexol (OMNIPAQUE) 300 MG/ML solution 100 mL (100 mLs Intravenous Contrast Given 11/20/21 1613)      IMPRESSION / MDM / ASSESSMENT AND PLAN / ED COURSE  I reviewed the triage vital signs and the nursing notes.                              Differential diagnosis includes, but is not limited to, cholecystitis, appendicitis, nephrolithiasis, pyelonephritis  The patient is a 46 year old female presenting with acute onset of right flank pain urinary symptoms and nausea vomiting.  Vital signs within normal limits.  Patient appears uncomfortable on my initial evaluation.  She has right CVA tenderness and is also tender in her upper and lower abdomen.  Given her abdominal tenderness I did obtain a CT with contrast so that we can also rule out appendicitis although I do suspect kidney stone.  Urine has 11-20 WBCs with bacteria with also a significant amount of squames.  CBC is notable for mild leukocytosis to 13, CMP with normal renal function.  CT does demonstrate a large proximal right ureteral stone.  After morphine and Toradol and Zofran patient's pain is significantly improved.  Given the first urine was quite contaminated I asked her to repeat the urine sample with proper cleaning.  Repeat urine is nitrite positive with WBCs again and also similar number of squames.  Discussed with urology on-call who had already reviewed her labs and imaging.  He notes that there is no evidence of pyelonephritis on the contrasted scan which is reassuring.  Given she is afebrile and seems to always have a leukocytosis on her labs and looks well without swelling signs of sepsis do both agree that she is appropriate for outpatient management.  On reevaluation patient's pain is significantly improved and she is tolerating p.o.  Will write for Motrin and oxycodone for breakthrough pain.  We will also write for Keflex in case there is concomitant UTI.  Urine culture sent.  We will have her follow-up with urology on Monday and likely lithotripsy versus stent next week.  We discussed return precautions for fever inability to  tolerate p.o. or uncontrolled pain.      FINAL CLINICAL IMPRESSION(S) / ED DIAGNOSES   Final diagnoses:  Kidney stone     Rx / DC Orders   ED Discharge Orders  Ordered    ibuprofen (ADVIL) 400 MG tablet  Every 6 hours PRN        11/20/21 1831    cephALEXin (KEFLEX) 500 MG capsule  4 times daily        11/20/21 1831    oxyCODONE (ROXICODONE) 5 MG immediate release tablet  Every 8 hours PRN        11/20/21 1831             Note:  This document was prepared using Dragon voice recognition software and may include unintentional dictation errors.   Rada Hay, MD 11/20/21 [phone]

## 2021-11-21 ENCOUNTER — Telehealth: Payer: Self-pay | Admitting: Urology

## 2021-11-21 NOTE — Telephone Encounter (Signed)
LMOM for pt to call office to schedule appt in [place] Tuesday morning at 8:15 w/[place]

## 2021-11-23 LAB — URINE CULTURE: Culture: >=100000 — AB

## 2021-11-25 ENCOUNTER — Encounter: Payer: Self-pay | Admitting: Urology

## 2021-11-25 ENCOUNTER — Telehealth: Payer: Self-pay

## 2021-11-25 ENCOUNTER — Other Ambulatory Visit: Payer: Self-pay

## 2021-11-25 ENCOUNTER — Ambulatory Visit
Admission: RE | Admit: 2021-11-25 | Discharge: 2021-11-25 | Disposition: A | Discharge status: Home / Self Care | Payer: 59 | Attending: Urology | Admitting: Urology

## 2021-11-25 ENCOUNTER — Other Ambulatory Visit
Admission: RE | Admit: 2021-11-25 | Discharge: 2021-11-25 | Disposition: A | Discharge status: Home / Self Care | Payer: 59 | Source: Home / Self Care | Attending: Urology | Admitting: Urology

## 2021-11-25 ENCOUNTER — Ambulatory Visit
Admission: RE | Admit: 2021-11-25 | Discharge: 2021-11-25 | Disposition: A | Discharge status: Home / Self Care | Payer: 59 | Source: Ambulatory Visit | Attending: Urology | Admitting: Urology

## 2021-11-25 ENCOUNTER — Ambulatory Visit: Payer: 59 | Admitting: Urology

## 2021-11-25 ENCOUNTER — Other Ambulatory Visit: Payer: Self-pay | Admitting: Urology

## 2021-11-25 VITALS — BP 157/88 | HR 75 | Ht 59.0 in | Wt 162.8 lb

## 2021-11-25 DIAGNOSIS — N200 Calculus of kidney: Secondary | ICD-10-CM | POA: Insufficient documentation

## 2021-11-25 DIAGNOSIS — N201 Calculus of ureter: Secondary | ICD-10-CM

## 2021-11-25 LAB — URINALYSIS, COMPLETE (UACMP) WITH MICROSCOPIC
Bilirubin Urine: NEGATIVE
Glucose, UA: NEGATIVE mg/dL
Ketones, ur: NEGATIVE mg/dL
Leukocytes,Ua: NEGATIVE
Nitrite: NEGATIVE
Protein, ur: NEGATIVE mg/dL
Specific Gravity, Urine: 1.01 (ref 1.005–1.030)
pH: 6.5 (ref 5.0–8.0)

## 2021-11-25 MED ORDER — OXYCODONE HCL 5 MG PO TABS: 5.0000 mg | ORAL_TABLET | Freq: Four times a day (QID) | ORAL | 0 refills | Status: DC | PRN

## 2021-11-25 MED ORDER — TAMSULOSIN HCL 0.4 MG PO CAPS: 0.4000 mg | ORAL_CAPSULE | Freq: Every day | ORAL | 0 refills | Status: DC

## 2021-11-25 NOTE — Telephone Encounter (Signed)
I spoke with Mallory Deleon. We have discussed possible surgery dates and 11/27/2021  was agreed upon by all parties. Patient given information about surgery date, what to expect pre-operatively and post operatively.    Informed patient that our office will communicate any additional care to be provided after surgery. Patients questions or concerns were discussed during our call. Advised to call our office should there be any additional information, questions or concerns that arise. Patient verbalized understanding.

## 2021-11-25 NOTE — Patient Instructions (Addendum)
Do not take aspirin, Aleve, ibuprofen, or Motrin, as this will force them to cancel your surgery.  Only take Flomax and oxycodone and regular Tylenol for pain.  Lithotripsy Lithotripsy is a treatment that can help break up kidney stones that are too large to pass on their own. This is a nonsurgical procedure that crushes a kidney stone with shock waves. These shock waves pass through your body and focus on the kidney stone. They cause the kidney stone to break up into smaller pieces while it is still in the urinary tract. The smaller pieces of stone can pass more easily out of your body in the urine. Tell a health care provider about: Any allergies you have. All medicines you are taking, including vitamins, herbs, eye drops, creams, and over-the-counter medicines. Any problems you or family members have had with anesthetic medicines. Any blood disorders you have. Any surgeries you have had. Any medical conditions you have. Whether you are pregnant or may be pregnant. What are the risks? Generally, this is a safe procedure. However, problems may occur, including: Infection. Bleeding from the kidney. Bruising of the kidney or skin. Scarring of the kidney, which can lead to: Increased blood pressure. Poor kidney function. Return (recurrence) of kidney stones. Damage to other structures or organs, such as the liver, colon, spleen, or pancreas. Blockage (obstruction) of the tube that carries urine from the kidney to the bladder (ureter). Failure of the kidney stone to break into pieces (fragments). What happens before the procedure? Staying hydrated Follow instructions from your health care provider about hydration, which may include: Up to 2 hours before the procedure - you may continue to drink clear liquids, such as water, clear fruit juice, black coffee, and plain tea. Eating and drinking restrictions Follow instructions from your health care provider about eating and drinking, which may  include: 8 hours before the procedure - stop eating heavy meals or foods, such as meat, fried foods, or fatty foods. 6 hours before the procedure - stop eating light meals or foods, such as toast or cereal. 6 hours before the procedure - stop drinking milk or drinks that contain milk. 2 hours before the procedure - stop drinking clear liquids. Medicines Ask your health care provider about: Changing or stopping your regular medicines. This is especially important if you are taking diabetes medicines or blood thinners. Taking medicines such as aspirin and ibuprofen. These medicines can thin your blood. Do not take these medicines unless your health care provider tells you to take them. Taking over-the-counter medicines, vitamins, herbs, and supplements. Tests You may have tests, such as: Blood tests. Urine tests. Imaging tests, such as a CT scan. General instructions Plan to have someone take you home from the hospital or clinic. If you will be going home right after the procedure, plan to have someone with you for 24 hours. Ask your health care provider what steps will be taken to help prevent infection. These may include washing skin with a germ-killing soap. What happens during the procedure?  An IV will be inserted into one of your veins. You will be given one or more of the following: A medicine to help you relax (sedative). A medicine to make you fall asleep (general anesthetic). A water-filled cushion may be placed behind your kidney or on your abdomen. In some cases, you may be placed in a tub of lukewarm water. Your body will be positioned in a way that makes it easy to target the kidney stone. An X-ray  or ultrasound exam will be done to locate your stone. Shock waves will be aimed at the stone. If you are awake, you may feel a tapping sensation as the shock waves pass through your body. A flexible tube with holes in it (stent) may be placed in the ureter. This will help keep  urine flowing from the kidney if the fragments of the stone have been blocking the ureter. The procedure may vary among health care providers and hospitals. What happens after the procedure? You may have an X-ray to see whether the procedure was able to break up the kidney stone and how much of the stone has passed. If large stone fragments remain after treatment, you may need to have a second procedure at a later time. Your blood pressure, heart rate, breathing rate, and blood oxygen level will be monitored until you leave the hospital or clinic. You may be given antibiotics or pain medicine as needed. If a stent was placed in your ureter during surgery, it may stay in place for a few weeks. You may need to strain your urine to collect pieces of the kidney stone for testing. You will need to drink plenty of water. If you were given a sedative during the procedure, it can affect you for several hours. Do not drive or operate machinery until your health care provider says that it is safe. Summary Lithotripsy is a treatment that can help break up kidney stones that are too large to pass on their own. Lithotripsy is a nonsurgical procedure that crushes a kidney stone with shock waves. Generally, this is a safe procedure. However, problems may occur, including damage to the kidney or other organs, infection, or obstruction of the tube that carries urine from the kidney to the bladder (ureter). You may have a stent placed in your ureter to help drain your urine. This stent may stay in place for a few weeks. After the procedure, you will need to drink plenty of water. You may be asked to strain your urine to collect pieces of the kidney stone for testing. This information is not intended to replace advice given to you by your health care provider. Make sure you discuss any questions you have with your health care provider. Document Revised: 08/15/2019 Document Reviewed: 08/16/2019 Elsevier Patient  Education  [place] After This sheet gives you information about how to care for yourself after your procedure. Your health care provider may also give you more specific instructions. If you have problems or questions, contact your health care provider. What can I expect after the procedure? After the procedure, it is common to have: Some blood in your urine. This should only last for a few days. Soreness in your back, sides, or upper abdomen for a few days. Blotches or bruises on the area where the shock wave entered the skin. Pain, discomfort, or nausea when pieces (fragments) of the kidney stone move through the tube that carries urine from the kidney to the bladder (ureter). Stone fragments may pass soon after the procedure, but they may continue to pass for up to 4-8 weeks. If you have severe pain or nausea, contact your health care provider. This may be caused by a large stone that was not broken up, and this may mean that you need more treatment. Some pain or discomfort during urination. Some pain or discomfort in the lower abdomen or (in men) at the base of the penis. Follow these instructions at home: Medicines  Take over-the-counter and prescription medicines only as told by your health care provider. If you were prescribed an antibiotic medicine, take it as told by your health care provider. Do not stop taking the antibiotic even if you start to feel better. Ask your health care provider if the medicine prescribed to you requires you to avoid driving or using machinery. Eating and drinking   Drink enough fluid to keep your urine pale yellow. This helps any remaining pieces of the stone to pass. It can also help prevent new stones from forming. Eat plenty of fresh fruits and vegetables. Follow instructions from your health care provider about eating or drinking restrictions. You may be instructed to: Reduce how much salt (sodium) you eat or drink. Check  ingredients and nutrition facts on packaged foods and beverages to see how much sodium they contain. Reduce how much meat you eat. Eat the recommended amount of calcium for your age and gender. Ask your health care provider how much calcium you should have. General instructions Get plenty of rest. Return to your normal activities as told by your health care provider. Ask your health care provider what activities are safe for you. Most people can resume normal activities 1-2 days after the procedure. If you were given a sedative during the procedure, it can affect you for several hours. Do not drive or operate machinery until your health care provider says that it is safe. Your health care provider may direct you to lie in a certain position (postural drainage) and tap firmly (percuss) over your kidney area to help stone fragments pass. Follow instructions as told by your health care provider. If directed, strain all urine through the strainer that was provided by your health care provider. Keep all fragments for your health care provider to see. Any stones that are found may be sent to a medical lab for examination. The stone may be as small as a grain of salt. Keep all follow-up visits as told by your health care provider. This is important. Contact a health care provider if: You have a fever or chills. You have nausea that is severe or does not go away. You have any of these urinary symptoms: Blood in your urine for longer than your health care provider told you to expect. Urine that smells bad or unusual. Feeling a strong urge to urinate after emptying your bladder. Pain or burning with urination that does not go away. Urinating more often than usual and this does not go away. You have a stent and it comes out. Get help right away if: You have severe pain in your back, sides, or upper abdomen. You have any of these urinary symptoms: Severe pain while urinating. More blood in your urine or  having blood in your urine when you did not before. Passing blood clots in your urine. Passing only a small amount of urine or being unable to pass any urine at all. You have severe nausea that leads to persistent vomiting. You faint. Summary After this procedure, it is common to have some pain, discomfort, or nausea when pieces (fragments) of the kidney stone move through the tube that carries urine from the kidney to the bladder (ureter). If this pain or nausea is severe, however, you should contact your health care provider. Return to your normal activities as told by your health care provider. Ask your health care provider what activities are safe for you. Drink enough fluid to keep your urine pale yellow. This helps any remaining  pieces of the stone to pass, and it can help prevent new stones from forming. If directed, strain your urine and keep all fragments for your health care provider to see. Fragments or stones may be as small as a grain of salt. Get help right away if you have severe pain in your back, sides, or upper abdomen, or if you have severe pain while urinating. This information is not intended to replace advice given to you by your health care provider. Make sure you discuss any questions you have with your health care provider. Document Revised: 07/07/2021 Document Reviewed: 07/07/2021 Elsevier Patient Education  [place].

## 2021-11-25 NOTE — Progress Notes (Signed)
11/25/21 8:49 AM   Mallory Deleon 12-Oct-1976 [id number]  CC: Right flank pain  HPI: 46 year old female with history of stones requiring lithotripsy as well as stent in the past over 10 years ago who presented to the ER last week with severe right-sided flank pain.  CT showed a 12 mm right proximal ureteral stone with hydronephrosis, no other renal stones.  Urinalysis was contaminated with squamous cells but suspicious for UTI, and she was discharged on Keflex.  Urine culture did ultimately grow E. coli which was sensitive to the Keflex.  She continues to have right-sided flank and groin pain.  KUB today shows migration to the right mid ureter.  She denies fevers or chills, or any urinary symptoms.  She would like to avoid a stent if at all possible, she had significant pain from her stent previously.  Urinalysis today is benign with 0-5 squamous cells, 20-50 RBCs, 0-5 WBCs, rare bacteria, nitrite negative, no leukocytes.  PMH: Past Medical History:  Diagnosis Date   Kidney stones 2010   Thyroid disease    Wears dentures    partial upper    Surgical History: Past Surgical History:  Procedure Laterality Date   ABDOMINAL HYSTERECTOMY     COLONOSCOPY WITH PROPOFOL N/A 07/25/2021   Procedure: COLONOSCOPY WITH PROPOFOL;  Surgeon: Lucilla Lame, MD;  Location: [facility];  Service: Endoscopy;  Laterality: N/A;   LITHOTRIPSY Bilateral    POLYPECTOMY  07/25/2021   Procedure: POLYPECTOMY;  Surgeon: Lucilla Lame, MD;  Location: [facility];  Service: Endoscopy;;   SPLENECTOMY     TUBAL LIGATION       Family History: Family History  Problem Relation Age of Onset   Diabetes Mother    Diabetes Father    Heart disease Father        CABGx3   Thyroid disease Sister        hypothyroid   Hereditary spherocytosis Daughter    Hereditary spherocytosis Son     Social History:  reports that she has been smoking cigarettes. She has a 12.50 pack-year smoking history. She has  never used smokeless tobacco. She reports current drug use. Frequency: 6.00 times per week. Drug: Marijuana. She reports that she does not drink alcohol.  Physical Exam: BP (!) 157/88 (BP Location: Left Arm, Patient Position: Sitting, Cuff Size: Large)    Pulse 75    Ht 4\' 11"  (1.499 m)    Wt 162 lb 12.8 oz (73.8 kg)    BMI 32.88 kg/m    Constitutional:  Alert and oriented, No acute distress. Cardiovascular: No clubbing, cyanosis, or edema. Respiratory: Normal respiratory effort, no increased work of breathing. GI: Abdomen is soft, nontender, nondistended, no abdominal masses  Laboratory Data: Reviewed, see HPI  Pertinent Imaging: I have personally viewed and interpreted the CT as well as the KUB today that shows a 1.2 cm right mid ureteral stone, clearly seen on KUB 1500HU, 13 cm SSD, no renal stones.  Assessment & Plan:   46 year old female with 1.2 cm right mid ureteral stone.  Original UA in ER contaminated with 11-20 squamous cells and grew E. coli, but was treated with Keflex, urinalysis today is benign and she has no clinical or laboratory signs of infection.  She has not tolerated a stent previously, and would like to avoid a stent if at all possible.  We discussed various treatment options for urolithiasis including observation with or without medical expulsive therapy, shockwave lithotripsy (SWL), ureteroscopy and laser lithotripsy with  stent placement, and percutaneous nephrolithotomy.  We discussed that management is based on stone size, location, density, patient co-morbidities, and patient preference.   Stones <19mm in size have a >80% spontaneous passage rate. Data surrounding the use of tamsulosin for medical expulsive therapy is controversial, but meta analyses suggests it is most efficacious for distal stones between 5-16mm in size. Possible side effects include dizziness/lightheadedness, and retrograde ejaculation.  SWL has a lower stone free rate in a single procedure,  but also a lower complication rate compared to ureteroscopy and avoids a stent and associated stent related symptoms. Possible complications include renal hematoma, steinstrasse, and need for additional treatment.  We discussed at length that her stone is quite dense(1400HU) but she prefers SWL over ureteroscopy.  Ureteroscopy with laser lithotripsy and stent placement has a higher stone free rate than SWL in a single procedure, however increased complication rate including possible infection, ureteral injury, bleeding, and stent related morbidity. Common stent related symptoms include dysuria, urgency/frequency, and flank pain.  After an extensive discussion of the risks and benefits of the above treatment options, the patient would like to proceed with right shockwave lithotripsy this week   Nickolas Madrid, MD 11/25/2021  [facility] [address] [place], [place] [address] [phone]

## 2021-11-25 NOTE — Progress Notes (Signed)
[facility] ORDER FORM  Expected date of procedure: 11/27/2020  Surgeon: MD on truck  Post op standing: 2-4wk follow up w/KUB prior with PA  Anticoagulation/Aspirin/NSAID standing order: Hold all 72 hours prior  Anesthesia standing order: MAC  VTE standing: SCD's  Dx: Right Ureteral Stone  Procedure: right Extracorporeal shock wave lithotripsy  CPT : 92330  Standing Order Set:   *NPO after mn, KUB  *NS 153m/hr, Keflex 5020mPO, Benadryl 253mO, Valium 63m77m, Zofran 4mg 34m   Medications if other than standing orders:   NONE

## 2021-11-25 NOTE — H&P (View-Only) (Signed)
11/25/21 8:49 AM   Mallory Deleon June 03, 1976 [id number]  CC: Right flank pain  HPI: 46 year old female with history of stones requiring lithotripsy as well as stent in the past over 10 years ago who presented to the ER last week with severe right-sided flank pain.  CT showed a 12 mm right proximal ureteral stone with hydronephrosis, no other renal stones.  Urinalysis was contaminated with squamous cells but suspicious for UTI, and she was discharged on Keflex.  Urine culture did ultimately grow E. coli which was sensitive to the Keflex.  She continues to have right-sided flank and groin pain.  KUB today shows migration to the right mid ureter.  She denies fevers or chills, or any urinary symptoms.  She would like to avoid a stent if at all possible, she had significant pain from her stent previously.  Urinalysis today is benign with 0-5 squamous cells, 20-50 RBCs, 0-5 WBCs, rare bacteria, nitrite negative, no leukocytes.  PMH: Past Medical History:  Diagnosis Date   Kidney stones 2010   Thyroid disease    Wears dentures    partial upper    Surgical History: Past Surgical History:  Procedure Laterality Date   ABDOMINAL HYSTERECTOMY     COLONOSCOPY WITH PROPOFOL N/A 07/25/2021   Procedure: COLONOSCOPY WITH PROPOFOL;  Surgeon: Lucilla Lame, MD;  Location: [place];  Service: Endoscopy;  Laterality: N/A;   LITHOTRIPSY Bilateral    POLYPECTOMY  07/25/2021   Procedure: POLYPECTOMY;  Surgeon: Lucilla Lame, MD;  Location: [place];  Service: Endoscopy;;   SPLENECTOMY     TUBAL LIGATION       Family History: Family History  Problem Relation Age of Onset   Diabetes Mother    Diabetes Father    Heart disease Father        CABGx3   Thyroid disease Sister        hypothyroid   Hereditary spherocytosis Daughter    Hereditary spherocytosis Son     Social History:  reports that she has been smoking cigarettes. She has a 12.50 pack-year smoking history. She has  never used smokeless tobacco. She reports current drug use. Frequency: 6.00 times per week. Drug: Marijuana. She reports that she does not drink alcohol.  Physical Exam: BP (!) 157/88 (BP Location: Left Arm, Patient Position: Sitting, Cuff Size: Large)    Pulse 75    Ht 4\' 11"  (1.499 m)    Wt 162 lb 12.8 oz (73.8 kg)    BMI 32.88 kg/m    Constitutional:  Alert and oriented, No acute distress. Cardiovascular: No clubbing, cyanosis, or edema. Respiratory: Normal respiratory effort, no increased work of breathing. GI: Abdomen is soft, nontender, nondistended, no abdominal masses  Laboratory Data: Reviewed, see HPI  Pertinent Imaging: I have personally viewed and interpreted the CT as well as the KUB today that shows a 1.2 cm right mid ureteral stone, clearly seen on KUB 1500HU, 13 cm SSD, no renal stones.  Assessment & Plan:   46 year old female with 1.2 cm right mid ureteral stone.  Original UA in ER contaminated with 11-20 squamous cells and grew E. coli, but was treated with Keflex, urinalysis today is benign and she has no clinical or laboratory signs of infection.  She has not tolerated a stent previously, and would like to avoid a stent if at all possible.  We discussed various treatment options for urolithiasis including observation with or without medical expulsive therapy, shockwave lithotripsy (SWL), ureteroscopy and laser lithotripsy with  stent placement, and percutaneous nephrolithotomy.  We discussed that management is based on stone size, location, density, patient co-morbidities, and patient preference.   Stones <51mm in size have a >80% spontaneous passage rate. Data surrounding the use of tamsulosin for medical expulsive therapy is controversial, but meta analyses suggests it is most efficacious for distal stones between 5-98mm in size. Possible side effects include dizziness/lightheadedness, and retrograde ejaculation.  SWL has a lower stone free rate in a single procedure,  but also a lower complication rate compared to ureteroscopy and avoids a stent and associated stent related symptoms. Possible complications include renal hematoma, steinstrasse, and need for additional treatment.  We discussed at length that her stone is quite dense(1400HU) but she prefers SWL over ureteroscopy.  Ureteroscopy with laser lithotripsy and stent placement has a higher stone free rate than SWL in a single procedure, however increased complication rate including possible infection, ureteral injury, bleeding, and stent related morbidity. Common stent related symptoms include dysuria, urgency/frequency, and flank pain.  After an extensive discussion of the risks and benefits of the above treatment options, the patient would like to proceed with right shockwave lithotripsy this week   Nickolas Madrid, MD 11/25/2021  [facility] [address] [place], [place] [address] [phone]

## 2021-11-27 ENCOUNTER — Other Ambulatory Visit: Payer: Self-pay

## 2021-11-27 ENCOUNTER — Encounter: Payer: Self-pay | Admitting: Urology

## 2021-11-27 ENCOUNTER — Ambulatory Visit: Payer: 59

## 2021-11-27 ENCOUNTER — Ambulatory Visit
Admission: RE | Admit: 2021-11-27 | Discharge: 2021-11-27 | Disposition: A | Discharge status: Home / Self Care | Payer: 59 | Attending: Urology | Admitting: Urology

## 2021-11-27 ENCOUNTER — Encounter
Admission: RE | Disposition: A | Discharge status: Home / Self Care | Payer: Self-pay | Source: Home / Self Care | Attending: Urology

## 2021-11-27 DIAGNOSIS — F17210 Nicotine dependence, cigarettes, uncomplicated: Secondary | ICD-10-CM | POA: Insufficient documentation

## 2021-11-27 DIAGNOSIS — F1290 Cannabis use, unspecified, uncomplicated: Secondary | ICD-10-CM | POA: Insufficient documentation

## 2021-11-27 DIAGNOSIS — N201 Calculus of ureter: Secondary | ICD-10-CM | POA: Diagnosis present

## 2021-11-27 DIAGNOSIS — N132 Hydronephrosis with renal and ureteral calculous obstruction: Secondary | ICD-10-CM | POA: Diagnosis not present

## 2021-11-27 HISTORY — PX: EXTRACORPOREAL SHOCK WAVE LITHOTRIPSY: SHX1557

## 2021-11-27 SURGERY — LITHOTRIPSY, ESWL
Anesthesia: Monitor Anesthesia Care | Laterality: Right

## 2021-11-27 MED ORDER — SODIUM CHLORIDE 0.9 % IV SOLN: INTRAVENOUS | Status: DC

## 2021-11-27 MED ORDER — KETOROLAC TROMETHAMINE 30 MG/ML IJ SOLN
INTRAMUSCULAR | Status: AC
  Filled 2021-11-27: qty 1

## 2021-11-27 MED ORDER — DIAZEPAM 5 MG PO TABS: 10.0000 mg | ORAL_TABLET | ORAL | Status: AC

## 2021-11-27 MED ORDER — DIPHENHYDRAMINE HCL 25 MG PO CAPS
ORAL_CAPSULE | ORAL | Status: AC
  Administered 2021-11-27: 25 mg via ORAL
  Filled 2021-11-27: qty 1

## 2021-11-27 MED ORDER — CEPHALEXIN 500 MG PO CAPS: 500.0000 mg | ORAL_CAPSULE | Freq: Once | ORAL | Status: AC

## 2021-11-27 MED ORDER — CEPHALEXIN 500 MG PO CAPS
ORAL_CAPSULE | ORAL | Status: AC
  Administered 2021-11-27: 500 mg via ORAL
  Filled 2021-11-27: qty 1

## 2021-11-27 MED ORDER — DIAZEPAM 5 MG PO TABS
ORAL_TABLET | ORAL | Status: AC
  Administered 2021-11-27: 10 mg via ORAL
  Filled 2021-11-27: qty 2

## 2021-11-27 MED ORDER — KETOROLAC TROMETHAMINE 30 MG/ML IJ SOLN
30.0000 mg | Freq: Once | INTRAMUSCULAR | Status: AC
  Administered 2021-11-27: 30 mg via INTRAVENOUS

## 2021-11-27 MED ORDER — OXYCODONE HCL 5 MG PO TABS: 5.0000 mg | ORAL_TABLET | Freq: Four times a day (QID) | ORAL | 0 refills | Status: DC | PRN

## 2021-11-27 MED ORDER — ONDANSETRON HCL 4 MG/2ML IJ SOLN
INTRAMUSCULAR | Status: AC
  Administered 2021-11-27: 4 mg via INTRAVENOUS
  Filled 2021-11-27: qty 2

## 2021-11-27 MED ORDER — DIPHENHYDRAMINE HCL 25 MG PO CAPS: 25.0000 mg | ORAL_CAPSULE | ORAL | Status: AC

## 2021-11-27 MED ORDER — ONDANSETRON HCL 4 MG/2ML IJ SOLN: 4.0000 mg | Freq: Once | INTRAMUSCULAR | Status: AC

## 2021-11-27 NOTE — Interval H&P Note (Signed)
History and Physical Interval Note:  11/27/2021 9:59 AM  Mallory Deleon  has presented today for surgery, with the diagnosis of Right Ureteral Stone.  The various methods of treatment have been discussed with the patient and family. After consideration of risks, benefits and other options for treatment, the patient has consented to  Procedure(s): EXTRACORPOREAL SHOCK WAVE LITHOTRIPSY (ESWL) (Right) as a surgical intervention.  The patient's history has been reviewed, patient examined, no change in status, stable for surgery.  I have reviewed the patient's chart and labs.  Questions were answered to the patient's satisfaction.    RRR CTAB  Hollice Espy

## 2021-11-27 NOTE — Discharge Instructions (Signed)
See [facility] discharge instructions in chart.  

## 2021-12-02 ENCOUNTER — Ambulatory Visit
Admission: RE | Admit: 2021-12-02 | Discharge: 2021-12-02 | Disposition: A | Discharge status: Home / Self Care | Payer: 59 | Source: Ambulatory Visit | Attending: Urology | Admitting: Urology

## 2021-12-02 ENCOUNTER — Other Ambulatory Visit: Payer: Self-pay

## 2021-12-02 ENCOUNTER — Ambulatory Visit (INDEPENDENT_AMBULATORY_CARE_PROVIDER_SITE_OTHER): Payer: 59 | Admitting: Urology

## 2021-12-02 ENCOUNTER — Encounter: Payer: Self-pay | Admitting: Urology

## 2021-12-02 ENCOUNTER — Other Ambulatory Visit: Payer: Self-pay | Admitting: *Deleted

## 2021-12-02 ENCOUNTER — Ambulatory Visit
Admission: RE | Admit: 2021-12-02 | Discharge: 2021-12-02 | Disposition: A | Discharge status: Home / Self Care | Payer: 59 | Attending: Urology | Admitting: Urology

## 2021-12-02 VITALS — BP 150/86 | HR 88 | Temp 98.3°F | Ht 59.0 in | Wt 162.0 lb

## 2021-12-02 DIAGNOSIS — B9620 Unspecified Escherichia coli [E. coli] as the cause of diseases classified elsewhere: Secondary | ICD-10-CM

## 2021-12-02 DIAGNOSIS — N201 Calculus of ureter: Secondary | ICD-10-CM

## 2021-12-02 DIAGNOSIS — N390 Urinary tract infection, site not specified: Secondary | ICD-10-CM | POA: Diagnosis not present

## 2021-12-02 LAB — MICROSCOPIC EXAMINATION: Bacteria, UA: NONE SEEN

## 2021-12-02 LAB — URINALYSIS, COMPLETE
Bilirubin, UA: NEGATIVE
Glucose, UA: NEGATIVE
Ketones, UA: NEGATIVE
Nitrite, UA: NEGATIVE
Protein,UA: NEGATIVE
Specific Gravity, UA: 1.01 (ref 1.005–1.030)
Urobilinogen, Ur: 0.2 mg/dL (ref 0.2–1.0)
pH, UA: 6.5 (ref 5.0–7.5)

## 2021-12-02 MED ORDER — FLUCONAZOLE 150 MG PO TABS: 150.0000 mg | ORAL_TABLET | Freq: Once | ORAL | 0 refills | Status: AC

## 2021-12-02 MED ORDER — CEPHALEXIN 500 MG PO CAPS: 500.0000 mg | ORAL_CAPSULE | Freq: Four times a day (QID) | ORAL | 0 refills | Status: AC

## 2021-12-02 MED ORDER — KETOROLAC TROMETHAMINE 60 MG/2ML IM SOLN
60.0000 mg | Freq: Once | INTRAMUSCULAR | Status: AC
  Administered 2021-12-02: 60 mg via INTRAMUSCULAR

## 2021-12-02 MED ORDER — CEFTRIAXONE SODIUM 1 G IJ SOLR
1.0000 g | Freq: Once | INTRAMUSCULAR | Status: AC
  Administered 2021-12-02: 1 g via INTRAMUSCULAR

## 2021-12-02 NOTE — Progress Notes (Signed)
12/02/2021 10:22 AM   Mallory Deleon December 18, 1975 [id number]  Referring provider: Jearld Fenton, NP [name],  [name] [id number]  Chief Complaint  Patient presents with   Nephrolithiasis   Urological history: 1.  Nephrolithiasis -Stone composition of 60% calcium phosphate and 40% calcium oxalate dihydrate -ESWL and URS ~ 10 years ago   HPI: Mallory Deleon is a 46 y.o. female who is a patient of Dr. Diamantina Providence who underwent ESWL on 11/27/2021 for a mid right 1.2 cm stone with Dr. Erlene Quan who called this morning complaining of fever and right-sided flank pain.  UA 11-30 WBC's   KUB right ureteral stone has now migrated past the pelvic brim.  She states she had a difficult time with ESWL as she "woke up" several times during the procedure and she has been experiencing right sided flank pain since the procedure.  She was having fevers of 100.7 and 10 out of 10 flank pain last evening.  She states her flank pain is currently 6 out of 10.  She did note that she had only been taking her Keflex twice daily instead of the 4 times that it has been prescribed.  She is managing her pain with ibuprofen.   PMH: Past Medical History:  Diagnosis Date   Kidney stones 2010   Thyroid disease    Wears dentures    partial upper    Surgical History: Past Surgical History:  Procedure Laterality Date   ABDOMINAL HYSTERECTOMY     COLONOSCOPY WITH PROPOFOL N/A 07/25/2021   Procedure: COLONOSCOPY WITH PROPOFOL;  Surgeon: Lucilla Lame, MD;  Location: [place];  Service: Endoscopy;  Laterality: N/A;   EXTRACORPOREAL SHOCK WAVE LITHOTRIPSY Right 11/27/2021   Procedure: EXTRACORPOREAL SHOCK WAVE LITHOTRIPSY (ESWL);  Surgeon: Hollice Espy, MD;  Location: [facility];  Service: Urology;  Laterality: Right;   LITHOTRIPSY Bilateral    POLYPECTOMY  07/25/2021   Procedure: POLYPECTOMY;  Surgeon: Lucilla Lame, MD;  Location: [place];  Service: Endoscopy;;   SPLENECTOMY      TUBAL LIGATION      Home Medications:  Allergies as of 12/02/2021       Reactions   Naproxen Sodium Itching, Rash   Sulfa Antibiotics Hives, Rash        Medication List        Accurate as of December 02, 2021 10:22 AM. If you have any questions, ask your nurse or doctor.          Acidophilus Caps capsule Take by mouth.   b complex vitamins tablet Take by mouth.   cephALEXin 500 MG capsule Commonly known as: KEFLEX Take 1 capsule (500 mg total) by mouth 4 (four) times daily for 10 days. Started by: Zara Council, PA-C   fluconazole 150 MG tablet Commonly known as: DIFLUCAN Take 1 tablet (150 mg total) by mouth once for 1 dose. Started by: Zara Council, PA-C   folic acid 1 MG tablet Commonly known as: FOLVITE Take by mouth.   oxyCODONE 5 MG immediate release tablet Commonly known as: Roxicodone Take 1 tablet (5 mg total) by mouth every 6 (six) hours as needed for severe pain.   Potassium 99 MG Tabs Take by mouth.   tamsulosin 0.4 MG Caps capsule Commonly known as: FLOMAX Take 1 capsule (0.4 mg total) by mouth daily.        Allergies:  Allergies  Allergen Reactions   Naproxen Sodium Itching and Rash   Sulfa Antibiotics Hives  and Rash    Family History: Family History  Problem Relation Age of Onset   Diabetes Mother    Diabetes Father    Heart disease Father        CABGx3   Thyroid disease Sister        hypothyroid   Hereditary spherocytosis Daughter    Hereditary spherocytosis Son     Social History:  reports that she has been smoking cigarettes. She has a 12.50 pack-year smoking history. She has never used smokeless tobacco. She reports current drug use. Frequency: 6.00 times per week. Drug: Marijuana. She reports that she does not drink alcohol.  ROS: Pertinent ROS in HPI  Physical Exam: BP (!) 150/86    Pulse 88    Temp 98.3 F (36.8 C) (Oral)    Ht 4\' 11"  (1.499 m)    Wt 162 lb (73.5 kg)    BMI 32.72 kg/m   Constitutional:   Well nourished. Alert and oriented, No acute distress. HEENT: [place] AT, mask in place.  Trachea midline Cardiovascular: No clubbing, cyanosis, or edema. Respiratory: Normal respiratory effort, no increased work of breathing. Neurologic: Grossly intact, no focal deficits, moving all 4 extremities. Psychiatric: Normal mood and affect.  Laboratory Data: Lab Results  Component Value Date   WBC 13.3 (H) 11/20/2021   HGB 14.6 11/20/2021   HCT 42.1 11/20/2021   MCV 88.8 11/20/2021   PLT 588 (H) 11/20/2021    Lab Results  Component Value Date   CREATININE 0.52 11/20/2021    Lab Results  Component Value Date   HGBA1C 5.2 06/20/2021    Lab Results  Component Value Date   TSH 1.23 06/20/2021       Component Value Date/Time   CHOL 224 (H) 06/20/2021 1419   HDL 45 (L) 06/20/2021 1419   CHOLHDL 5.0 (H) 06/20/2021 1419   VLDL 35 (H) 03/31/2017 0849   LDLCALC 153 (H) 06/20/2021 1419    Lab Results  Component Value Date   AST 13 06/20/2021   Lab Results  Component Value Date   ALT 11 06/20/2021    Urinalysis Results for orders placed or performed in visit on 12/02/21  Microscopic Examination   Urine  Result Value Ref Range   WBC, UA 11-30 (A) 0 - 5 /hpf   RBC 0-2 0 - 2 /hpf   Epithelial Cells (non renal) 0-10 0 - 10 /hpf   Bacteria, UA None seen None seen/Few  Urinalysis, Complete  Result Value Ref Range   Specific Gravity, UA 1.010 1.005 - 1.030   pH, UA 6.5 5.0 - 7.5   Color, UA Straw Yellow   Appearance Ur Clear Clear   Leukocytes,UA 1+ (A) Negative   Protein,UA Negative Negative/Trace   Glucose, UA Negative Negative   Ketones, UA Negative Negative   RBC, UA 1+ (A) Negative   Bilirubin, UA Negative Negative   Urobilinogen, Ur 0.2 0.2 - 1.0 mg/dL   Nitrite, UA Negative Negative   Microscopic Examination See below:       I have reviewed the labs.   Pertinent Imaging: CLINICAL DATA:  Lithotripsy 11/27/2021, right-sided pain   EXAM: ABDOMEN - 1 VIEW    COMPARISON:  11/27/2021, 11/20/2021   FINDINGS: Two supine frontal views of the abdomen and pelvis are obtained. A distal right ureteral calculus is again identified overlying the right sacral ala, measuring 11 mm in size. There is a smaller 3 mm calcification distal to this, likely in the region of the UVJ, new  since prior study. Numerous other pelvic phleboliths are stable. No other urinary tract calculi. Bowel gas pattern is unremarkable.   IMPRESSION: 1. Fragmentation of the distal right ureteral calculus seen previously, with 11 mm fragment overlying the distal right ureter at the level of the sacral ala, and 3 mm fragment in the region of the right UVJ.     Electronically Signed   By: Randa Ngo M.D.   On: 12/02/2021 15:25     Result History  I have independently reviewed the films.    Assessment & Plan:    1. Right ureteral stone -Has migrated distally - Urinalysis, Complete - CULTURE, URINE COMPREHENSIVE -She would like to try to pass it on her own as she does not want a ureteral stent -She states the tamsulosin was too expensive and I offered a good Rx coupon, but she deferred stating the tamsulosin had not been helpful in the past -She is given Toradol 60 mg IM here in the office and will continue ibuprofen at home -She will follow-up next week for KUB -Return precautions discussed  2.  E. coli UTI -Current UTI may not have been adequately treated as patient was only taking Keflex twice daily -Given her a gram of Rocephin here in the office and refill the Keflex for 4 times daily -Also sent a prescription for Diflucan to the pharmacy   Return in about 1 week (around 12/09/2021) for KUB .  These notes generated with voice recognition software. I apologize for typographical errors.  Zara Council, PA-C  [facility] [address]  [place], [place] [address] [phone]

## 2021-12-04 ENCOUNTER — Encounter: Payer: 59 | Admitting: Urology

## 2021-12-05 LAB — CALCULI, WITH PHOTOGRAPH (CLINICAL LAB)
Calcium Oxalate Dihydrate: 50 %
Calcium Oxalate Monohydrate: 20 %
Hydroxyapatite: 30 %
Weight Calculi: 2 mg

## 2021-12-09 LAB — CULTURE, URINE COMPREHENSIVE

## 2021-12-18 NOTE — Progress Notes (Incomplete)
12/18/21 11:15 AM   Mallory Deleon Dec 11, 1975 [id number]  Referring provider:  Jearld Fenton, NP [address] [place],  [place] [address] No chief complaint on file.   Urological history  1.  Nephrolithiasis -Stone composition of 60% calcium phosphate and 40% calcium oxalate dihydrate -ESWL and URS ~ 10 years ago  2. Right ureteral stone  - ESWL on 11/27/2021 with Dr.Brandon  - Stone analysis 20% calcium monohydrate, 50% calcium oxalate dihydrate, and 30% hydroxyapatite.  - KUB 12/02/2021 visualized distal right ureteral calculus seen previously, with 11 mm fragment overlying the distal right ureter at the level of the sacral ala, and 3 mm fragment in the region of the right UVJ.   HPI: Mallory Deleon is a 46 y.o.female who presents today for post ESWL follow-up with KUB prior.   She is having *** She denies ***      PMH: Past Medical History:  Diagnosis Date   Kidney stones 2010   Thyroid disease    Wears dentures    partial upper    Surgical History: Past Surgical History:  Procedure Laterality Date   ABDOMINAL HYSTERECTOMY     COLONOSCOPY WITH PROPOFOL N/A 07/25/2021   Procedure: COLONOSCOPY WITH PROPOFOL;  Surgeon: Lucilla Lame, MD;  Location: [place];  Service: Endoscopy;  Laterality: N/A;   EXTRACORPOREAL SHOCK WAVE LITHOTRIPSY Right 11/27/2021   Procedure: EXTRACORPOREAL SHOCK WAVE LITHOTRIPSY (ESWL);  Surgeon: Hollice Espy, MD;  Location: [facility];  Service: Urology;  Laterality: Right;   LITHOTRIPSY Bilateral    POLYPECTOMY  07/25/2021   Procedure: POLYPECTOMY;  Surgeon: Lucilla Lame, MD;  Location: [place];  Service: Endoscopy;;   SPLENECTOMY     TUBAL LIGATION      Home Medications:  Allergies as of 12/19/2021       Reactions   Naproxen Sodium Itching, Rash   Sulfa Antibiotics Hives, Rash        Medication List        Accurate as of December 18, 2021 11:15 AM. If you have any questions, ask your nurse or doctor.           Acidophilus Caps capsule Take by mouth.   b complex vitamins tablet Take by mouth.   folic acid 1 MG tablet Commonly known as: FOLVITE Take by mouth.   oxyCODONE 5 MG immediate release tablet Commonly known as: Roxicodone Take 1 tablet (5 mg total) by mouth every 6 (six) hours as needed for severe pain.   Potassium 99 MG Tabs Take by mouth.   tamsulosin 0.4 MG Caps capsule Commonly known as: FLOMAX Take 1 capsule (0.4 mg total) by mouth daily.        Allergies:  Allergies  Allergen Reactions   Naproxen Sodium Itching and Rash   Sulfa Antibiotics Hives and Rash    Family History: Family History  Problem Relation Age of Onset   Diabetes Mother    Diabetes Father    Heart disease Father        CABGx3   Thyroid disease Sister        hypothyroid   Hereditary spherocytosis Daughter    Hereditary spherocytosis Son     Social History:  reports that she has been smoking cigarettes. She has a 12.50 pack-year smoking history. She has never used smokeless tobacco. She reports current drug use. Frequency: 6.00 times per week. Drug: Marijuana. She reports that she does not drink alcohol.   Physical Exam: There were no vitals taken  for this visit.  Constitutional:  Alert and oriented, No acute distress. HEENT: [place] AT, moist mucus membranes.  Trachea midline, no masses. Cardiovascular: No clubbing, cyanosis, or edema. Respiratory: Normal respiratory effort, no increased work of breathing. GI: Abdomen is soft, nontender, nondistended, no abdominal masses GU: No CVA tenderness Lymph: No cervical or inguinal lymphadenopathy. Skin: No rashes, bruises or suspicious lesions. Neurologic: Grossly intact, no focal deficits, moving all 4 extremities. Psychiatric: Normal mood and affect.  Laboratory Data:  Lab Results  Component Value Date   CREATININE 0.52 11/20/2021   Lab Results  Component Value Date   HGBA1C 5.2 06/20/2021    Ref Range & Units 1 d ago  WBC 3.6 -  11.2 10*9/L 27.0 High    RBC 3.95 - 5.13 10*12/L 3.84 Low    HGB 11.3 - 14.9 g/dL 52.3 Low    HCT 12.1 - 44.0 % 32.1 Low    MCV 77.6 - 95.7 fL 83.4   MCH 25.9 - 32.4 pg 27.8   MCHC 32.0 - 36.0 g/dL 34.5   RDW 19.8 - 07.1 % 15.9 High    MPV 6.8 - 10.7 fL 8.4   Platelet 150 - 450 10*9/L 544 High    Resulting Agency  [facility]  Specimen Collected: 12/17/21 05:09 Last Resulted: 12/17/21 05:52  Received From: [facility]  Result Received: 12/18/21 07:28   WBC 3.6 - 11.2 10*9/L 23.0 High    RBC 3.95 - 5.13 10*12/L 3.55 Low    HGB 11.3 - 14.9 g/dL 05.3 Low    HCT 82.5 - 44.0 % 29.7 Low    MCV 77.6 - 95.7 fL 83.7   MCH 25.9 - 32.4 pg 28.7   MCHC 32.0 - 36.0 g/dL 51.4   RDW 86.3 - 73.1 % 15.7 High    MPV 6.8 - 10.7 fL 8.8   Platelet 150 - 450 10*9/L 497 High    Resulting Agency  [facility]ATOR[facility]  Specimen Collected: 12/18/21 05:54 Last Resulted: 12/18/21 06:40  Received From: [facility]  Result Received: 12/18/21 07:28    Ref Range & Units 1 d ago  Phosphorus 2.4 - 5.1 mg/dL 2.5   Resulting Agency  [facility]ATOR[facility]  Specimen Collected: 12/17/21 05:09 Last Resulted: 12/17/21 07:06  Received From: [facility]  Result Received: 12/18/21 07:28    Ref Range & Units 1 d ago  Magnesium 1.6 - 2.6 mg/dL 1.7   Resulting Agency  [facility]ATOR[facility]  Specimen Collected: 12/17/21 05:09 Last Resulted: 12/17/21 07:04  Received From: [facility]  Result Received: 12/18/21 07:28    Ref Range & Units 1 d ago  Sodium 135 - 145 mmol/L 141   Potassium 3.4 - 4.8 mmol/L 4.0   Chloride 98 - 107 mmol/L 106   CO2 20.0 - 31.0 mmol/L 24.0   Anion Gap 5 - 14 mmol/L 11   BUN 9 - 23 mg/dL 11   Creatinine 5.35 - 0.80 mg/dL 5.66 Low    BUN/Creatinine Ratio  19   eGFR CKD-EPI (2021) Female >=60 mL/min/1.21m2 >90   Comment: eGFR calculated with CKD-EPI 2021 equation in accordance with [employer] and  [employer] of Nephrology Task Force recommendations.  Glucose 70 - 179 mg/dL 518 High    Calcium 8.7 - 10.4 mg/dL 9.2   Resulting Agency  [facility] CL[facility] LABORATOR[facility]  Specimen Collected: 12/17/21 05:09 Last Resulted: 12/17/21 07:06  Received From: [facility]  Result Received: 12/18/21 07:28   Urinalysis   Pertinent Imaging:   Assessment & Plan:     No follow-ups on file.  [place] [address], [place], [place] [address] [phone]  I,Kailey Littlejohn,acting as a scribe for [facility], PA-C.,have documented all relevant documentation on the behalf of SHANNON MCGOWAN, PA-C,as directed by  [facility], PA-C while in the presence of [place], PA-C.

## 2021-12-19 ENCOUNTER — Ambulatory Visit: Payer: 59 | Admitting: Urology

## 2021-12-24 ENCOUNTER — Ambulatory Visit: Payer: 59 | Admitting: Urology

## 2021-12-26 ENCOUNTER — Ambulatory Visit: Payer: 59 | Admitting: Urology

## 2022-04-10 ENCOUNTER — Encounter: Payer: Self-pay | Admitting: Internal Medicine

## 2022-04-10 ENCOUNTER — Ambulatory Visit (INDEPENDENT_AMBULATORY_CARE_PROVIDER_SITE_OTHER): Payer: No Typology Code available for payment source | Admitting: Internal Medicine

## 2022-04-10 VITALS — BP 126/80 | HR 90 | Temp 97.7°F | Wt 172.0 lb

## 2022-04-10 DIAGNOSIS — R300 Dysuria: Secondary | ICD-10-CM | POA: Diagnosis not present

## 2022-04-10 DIAGNOSIS — R8290 Unspecified abnormal findings in urine: Secondary | ICD-10-CM

## 2022-04-10 DIAGNOSIS — R109 Unspecified abdominal pain: Secondary | ICD-10-CM

## 2022-04-10 DIAGNOSIS — Z87442 Personal history of urinary calculi: Secondary | ICD-10-CM

## 2022-04-10 LAB — POCT URINALYSIS DIPSTICK
Bilirubin, UA: NEGATIVE
Glucose, UA: NEGATIVE
Ketones, UA: NEGATIVE
Leukocytes, UA: NEGATIVE
Nitrite, UA: NEGATIVE
Protein, UA: NEGATIVE
Spec Grav, UA: 1.015 (ref 1.010–1.025)
Urobilinogen, UA: 0.2 E.U./dL
pH, UA: 6 (ref 5.0–8.0)

## 2022-04-10 MED ORDER — CEPHALEXIN 500 MG PO CAPS: 500.0000 mg | ORAL_CAPSULE | Freq: Two times a day (BID) | ORAL | 0 refills | Status: DC

## 2022-04-10 NOTE — Patient Instructions (Signed)

## 2022-04-10 NOTE — Progress Notes (Signed)
HPI  Pt presents to the clinic today with c/o urine odor, burning with urination and left flank pain. This started 3 days ago. She denies urgency, frequency, blood in her urine, vaginal discharge, odor, irritation or abnormal uterine bleeding. She denies fever, chills, nausea or vomiting. She has not tried anything OTC for this. She has a history of kidney stones.   Review of Systems  Past Medical History:  Diagnosis Date   Kidney stones 2010   Thyroid disease    Wears dentures    partial upper    Family History  Problem Relation Age of Onset   Diabetes Mother    Diabetes Father    Heart disease Father        CABGx3   Thyroid disease Sister        hypothyroid   Hereditary spherocytosis Daughter    Hereditary spherocytosis Son     Social History   Socioeconomic History   Marital status: Single    Spouse name: Not on file   Number of children: 3   Years of education: Not on file   Highest education level: Not on file  Occupational History   Not on file  Tobacco Use   Smoking status: Every Day    Packs/day: 0.50    Years: 25.00    Pack years: 12.50    Types: Cigarettes   Smokeless tobacco: Never   Tobacco comments:    smokes when she is bored and stressed  Vaping Use   Vaping Use: Every day  Substance and Sexual Activity   Alcohol use: No    Comment: rarely   Drug use: Yes    Frequency: 6.0 times per week    Types: Marijuana   Sexual activity: Yes  Other Topics Concern   Not on file  Social History Narrative   Not on file   Social Determinants of Health   Financial Resource Strain: Not on file  Food Insecurity: Not on file  Transportation Needs: Not on file  Physical Activity: Not on file  Stress: Not on file  Social Connections: Not on file  Intimate Partner Violence: Not on file    Allergies  Allergen Reactions   Naproxen Sodium Itching and Rash   Sulfa Antibiotics Hives and Rash     Constitutional: Denies fever, malaise, fatigue, headache  or abrupt weight changes.   GU: Pt reports urine odor, pain with urination and left flank pain. Denies urgency, frequency, blood in urine, or discharge. Skin: Denies redness, rashes, lesions or ulcercations.   No other specific complaints in a complete review of systems (except as listed in HPI above).    Objective:   Physical Exam  BP 126/80 (BP Location: Left Arm, Patient Position: Sitting, Cuff Size: Large)   Pulse 90   Temp 97.7 F (36.5 C) (Temporal)   Wt 172 lb (78 kg)   SpO2 99%   BMI 34.74 kg/m   Wt Readings from Last 3 Encounters:  12/02/21 162 lb (73.5 kg)  11/27/21 162 lb 12.8 oz (73.8 kg)  11/25/21 162 lb 12.8 oz (73.8 kg)    General: Appears her stated age, obese, in NAD. Cardiovascular: Normal rate and rhythm. S1,S2 noted.   Pulmonary/Chest: Normal effort and positive vesicular breath sounds. No respiratory distress. No wheezes, rales or ronchi noted.  Abdomen: Soft and nontender. Normal bowel sounds. No distention or masses noted.  No CVA tenderness.        Assessment & Plan:   Urine Odor, Dysuria, Left  Flank Pain, History of Kidney Stones  Urinalysis: 2+ blood Will send urine culture eRx sent if for Keflex 500 mg BID x 5 days OK to take AZO OTC Drink plenty of fluids She does not want CT renal stone study at this time for evaluation of possible kidney stone  RTC as needed or if symptoms persist. Webb Silversmith, NP

## 2022-04-12 LAB — URINE CULTURE
MICRO NUMBER:: 13453560
Result:: NO GROWTH
SPECIMEN QUALITY:: ADEQUATE

## 2022-04-17 ENCOUNTER — Encounter: Payer: Self-pay | Admitting: Internal Medicine

## 2022-04-17 MED ORDER — FLUCONAZOLE 150 MG PO TABS: 150.0000 mg | ORAL_TABLET | Freq: Once | ORAL | 0 refills | Status: AC

## 2022-04-20 ENCOUNTER — Other Ambulatory Visit (HOSPITAL_COMMUNITY)
Admission: RE | Admit: 2022-04-20 | Discharge: 2022-04-20 | Disposition: A | Discharge status: Home / Self Care | Payer: No Typology Code available for payment source | Source: Ambulatory Visit | Attending: Internal Medicine | Admitting: Internal Medicine

## 2022-04-20 ENCOUNTER — Encounter: Payer: Self-pay | Admitting: Internal Medicine

## 2022-04-20 ENCOUNTER — Ambulatory Visit (INDEPENDENT_AMBULATORY_CARE_PROVIDER_SITE_OTHER): Payer: No Typology Code available for payment source | Admitting: Internal Medicine

## 2022-04-20 VITALS — BP 144/92 | HR 79 | Temp 96.9°F | Wt 174.0 lb

## 2022-04-20 DIAGNOSIS — N898 Other specified noninflammatory disorders of vagina: Secondary | ICD-10-CM | POA: Diagnosis present

## 2022-04-20 DIAGNOSIS — N760 Acute vaginitis: Secondary | ICD-10-CM | POA: Diagnosis not present

## 2022-04-20 DIAGNOSIS — R21 Rash and other nonspecific skin eruption: Secondary | ICD-10-CM

## 2022-04-20 DIAGNOSIS — B9689 Other specified bacterial agents as the cause of diseases classified elsewhere: Secondary | ICD-10-CM | POA: Insufficient documentation

## 2022-04-20 DIAGNOSIS — B3731 Acute candidiasis of vulva and vagina: Secondary | ICD-10-CM | POA: Diagnosis not present

## 2022-04-20 MED ORDER — PREDNISONE 10 MG PO TABS: ORAL_TABLET | ORAL | 0 refills | Status: DC

## 2022-04-20 NOTE — Patient Instructions (Signed)
Vaginitis  Vaginitis is irritation and swelling of the vagina. Treatment will depend on the cause. What are the causes? It can be caused by: Bacteria. Yeast. A parasite. A virus. Low hormone levels. Bubble baths, scented tampons, and feminine sprays. Other things can change the balance of the yeast and bacteria that live in the vagina. These include: Antibiotic medicines. Not being clean enough. Some birth control methods. Sex. Infection. Diabetes. A weakened body defense system (immune system). What increases the risk? Smoking or being around someone who smokes. Using washes (douches), scented tampons, or scented pads. Wearing tight pants or thong underwear. Using birth control pills or an IUD. Having sex without a condom or having a lot of partners. Having an STI. Using a certain product to kill sperm (nonoxynol-9). Eating foods that are high in sugar. Having diabetes. Having low levels of a female hormone. Having a weakened body defense system. Being pregnant or breastfeeding. What are the signs or symptoms? Fluid coming from the vagina that is not normal. A bad smell. Itching, pain, or swelling. Pain with sex. Pain or burning when you pee (urinate). Sometimes there are no symptoms. How is this treated? Treatment may include: Antibiotic creams or pills. Antifungal medicines. Medicines to ease symptoms if you have a virus. Your sex partner should also be treated. Estrogen medicines. Avoiding scented soaps, sprays, or douches. Stopping use of products that caused irritation and then using a cream to treat symptoms. Follow these instructions at home: Lifestyle Keep the area around your vagina clean and dry. Avoid using soap. Rinse the area with water. Until your doctor says it is okay: Do not use washes for the vagina. Do not use tampons. Do not have sex. Wipe from front to back after going to the bathroom. When your doctor says it is okay, practice safe sex  and use condoms. General instructions Take over-the-counter and prescription medicines only as told by your doctor. If you were prescribed an antibiotic medicine, take or use it as told by your doctor. Do not stop taking or using it even if you start to feel better. Keep all follow-up visits. How is this prevented? Do not use things that can irritate the vagina, such as fabric softeners. Avoid these products if they are scented: Sprays. Detergents. Tampons. Products for cleaning the vagina. Soaps or bubble baths. Let air reach your vagina. To do this: Wear cotton underwear. Do not wear: Underwear while you sleep. Tight pants. Thong underwear. Underwear or nylons without a cotton panel. Take off any wet clothing, such as bathing suits, as soon as you can. Practice safe sex and use condoms. Contact a doctor if: You have pain in your belly or in the area between your hips. You have a fever or chills. Your symptoms last for more than 2-3 days. Get help right away if: You have a fever and your symptoms get worse all of a sudden. Summary Vaginitis is irritation and swelling of the vagina. Treatment will depend on the cause of the condition. Do not use washes or tampons or have sex until your doctor says it is okay. This information is not intended to replace advice given to you by your health care provider. Make sure you discuss any questions you have with your health care provider. Document Revised: 05/02/2020 Document Reviewed: 05/02/2020 Elsevier Patient Education  [place].

## 2022-04-20 NOTE — Progress Notes (Signed)
Subjective:    Patient ID: Mallory Deleon, female    DOB: 23-Jun-1976, 46 y.o.   MRN: [id number]  HPI  Patient presents to clinic today with complaint of a rash on her arms.  She noticed this last night.  The rash itches.  It has not seemed to spread.  She started taking Asgawanda 2 night ago to help her sleep.  She denies changes in soaps, lotions or detergents.  She has not tried anything OTC for this.  She also reports vaginal itching, irritation and discharge. This was started after she was treated for a UTI 04/10/22. I sent her in a Diflucan but she reports symptoms persistent.  She reports her UTI symptoms have resolved.  She would like to be tested for STDs as well.  Review of Systems  Past Medical History:  Diagnosis Date   Kidney stones 2010   Thyroid disease    Wears dentures    partial upper    Current Outpatient Medications  Medication Sig Dispense Refill   cephALEXin (KEFLEX) 500 MG capsule Take 1 capsule (500 mg total) by mouth 2 (two) times daily. 10 capsule 0   No current facility-administered medications for this visit.    Allergies  Allergen Reactions   Naproxen Sodium Itching and Rash   Sulfa Antibiotics Hives and Rash    Family History  Problem Relation Age of Onset   Diabetes Mother    Diabetes Father    Heart disease Father        CABGx3   Thyroid disease Sister        hypothyroid   Hereditary spherocytosis Daughter    Hereditary spherocytosis Son     Social History   Socioeconomic History   Marital status: Single    Spouse name: Not on file   Number of children: 3   Years of education: Not on file   Highest education level: Not on file  Occupational History   Not on file  Tobacco Use   Smoking status: Every Day    Packs/day: 0.50    Years: 25.00    Pack years: 12.50    Types: Cigarettes   Smokeless tobacco: Never   Tobacco comments:    smokes when she is bored and stressed  Vaping Use   Vaping Use: Every day  Substance and  Sexual Activity   Alcohol use: No    Comment: rarely   Drug use: Yes    Frequency: 6.0 times per week    Types: Marijuana   Sexual activity: Yes  Other Topics Concern   Not on file  Social History Narrative   Not on file   Social Determinants of Health   Financial Resource Strain: Not on file  Food Insecurity: Not on file  Transportation Needs: Not on file  Physical Activity: Not on file  Stress: Not on file  Social Connections: Not on file  Intimate Partner Violence: Not on file     Constitutional: Denies fever, malaise, fatigue, headache or abrupt weight changes.  Respiratory: Denies difficulty breathing, shortness of breath, cough or sputum production.   Cardiovascular: Denies chest pain, chest tightness, palpitations or swelling in the hands or feet.  Gastrointestinal: Denies abdominal pain, bloating, constipation, diarrhea or blood in the stool.  GU: Patient reports vaginal itching, irritation and discharge.  Denies urgency, frequency, pain with urination, burning sensation, blood in urine, odor. Skin: Patient reports rash of arms.  Denies lesions or ulcercations.   No other specific complaints in a  complete review of systems (except as listed in HPI above).     Objective:   Physical Exam  BP (!) 144/92 (BP Location: Right Arm, Patient Position: Sitting, Cuff Size: Normal)   Pulse 79   Temp (!) 96.9 F (36.1 C) (Temporal)   Wt 174 lb (78.9 kg)   SpO2 98%   BMI 35.14 kg/m   Wt Readings from Last 3 Encounters:  04/20/22 174 lb (78.9 kg)  04/10/22 172 lb (78 kg)  12/02/21 162 lb (73.5 kg)    General: Appears her stated age, obese, in NAD. Skin: Warm, dry and intact.  Grouped, maculopapular rash noted on bilateral upper arms. HEENT: Head: normal shape and size; Eyes: sclera white, PERRLA and EOMs intact;  Cardiovascular: Normal rate and rhythm. S1,S2 noted.  No murmur, rubs or gallops noted. Pulmonary/Chest: Normal effort and positive vesicular breath sounds.  No respiratory distress. No wheezes, rales or ronchi noted.  Abdomen: Soft and mildly tender in the suprapubic region. Pelvic: Self swab Musculoskeletal: No difficulty with gait.  Neurological: Alert and oriented.   BMET    Component Value Date/Time   NA 138 11/20/2021 1153   NA 137 02/20/2013 2253   K 4.1 11/20/2021 1153   K 4.1 02/20/2013 2253   CL 102 11/20/2021 1153   CL 107 02/20/2013 2253   CO2 27 11/20/2021 1153   CO2 24 02/20/2013 2253   GLUCOSE 114 (H) 11/20/2021 1153   GLUCOSE 96 02/20/2013 2253   BUN 11 11/20/2021 1153   BUN 9 02/20/2013 2253   CREATININE 0.52 11/20/2021 1153   CREATININE 0.66 06/20/2021 1419   CALCIUM 9.2 11/20/2021 1153   CALCIUM 9.2 02/20/2013 2253   GFRNONAA >60 11/20/2021 1153   GFRNONAA 123 06/29/2018 0827   GFRAA >60 06/14/2020 0416   GFRAA 142 06/29/2018 0827    Lipid Panel     Component Value Date/Time   CHOL 224 (H) 06/20/2021 1419   TRIG 132 06/20/2021 1419   HDL 45 (L) 06/20/2021 1419   CHOLHDL 5.0 (H) 06/20/2021 1419   VLDL 35 (H) 03/31/2017 0849   LDLCALC 153 (H) 06/20/2021 1419    CBC    Component Value Date/Time   WBC 13.3 (H) 11/20/2021 1153   RBC 4.74 11/20/2021 1153   HGB 14.6 11/20/2021 1153   HGB 14.0 02/20/2013 2253   HCT 42.1 11/20/2021 1153   HCT 41.1 02/20/2013 2253   PLT 588 (H) 11/20/2021 1153   PLT 625 (H) 02/20/2013 2253   MCV 88.8 11/20/2021 1153   MCV 72 (L) 02/20/2013 2253   MCH 30.8 11/20/2021 1153   MCHC 34.7 11/20/2021 1153   RDW 13.0 11/20/2021 1153   RDW 19.0 (H) 02/20/2013 2253   LYMPHSABS 5.7 (H) 06/14/2020 0416   MONOABS 1.4 (H) 06/14/2020 0416   EOSABS 0.3 06/14/2020 0416   BASOSABS 0.1 06/14/2020 0416    Hgb A1C Lab Results  Component Value Date   HGBA1C 5.2 06/20/2021            Assessment & Plan:   Rash of Arms:  Does appear 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

## 2022-04-21 LAB — CERVICOVAGINAL ANCILLARY ONLY
Bacterial Vaginitis (gardnerella): POSITIVE — AB
Candida Glabrata: NEGATIVE
Candida Vaginitis: POSITIVE — AB
Chlamydia: NEGATIVE
Comment: NEGATIVE
Comment: NEGATIVE
Comment: NEGATIVE
Comment: NEGATIVE
Comment: NEGATIVE
Comment: NORMAL
Neisseria Gonorrhea: NEGATIVE
Trichomonas: NEGATIVE

## 2022-04-21 MED ORDER — FLUCONAZOLE 150 MG PO TABS: ORAL_TABLET | ORAL | 0 refills | Status: DC

## 2022-04-21 MED ORDER — METRONIDAZOLE 500 MG PO TABS: 500.0000 mg | ORAL_TABLET | Freq: Two times a day (BID) | ORAL | 0 refills | Status: DC

## 2022-04-21 NOTE — Addendum Note (Signed)
Addended by: Jearld Fenton on: 04/21/2022 02:18 PM   Modules accepted: Orders

## 2022-06-26 ENCOUNTER — Ambulatory Visit (INDEPENDENT_AMBULATORY_CARE_PROVIDER_SITE_OTHER): Payer: No Typology Code available for payment source | Admitting: Internal Medicine

## 2022-06-26 ENCOUNTER — Encounter: Payer: Self-pay | Admitting: Internal Medicine

## 2022-06-26 VITALS — BP 138/84 | HR 81 | Temp 97.5°F | Ht 59.0 in | Wt 175.0 lb

## 2022-06-26 DIAGNOSIS — Z1231 Encounter for screening mammogram for malignant neoplasm of breast: Secondary | ICD-10-CM

## 2022-06-26 DIAGNOSIS — D72829 Elevated white blood cell count, unspecified: Secondary | ICD-10-CM | POA: Insufficient documentation

## 2022-06-26 DIAGNOSIS — E039 Hypothyroidism, unspecified: Secondary | ICD-10-CM

## 2022-06-26 DIAGNOSIS — E7800 Pure hypercholesterolemia, unspecified: Secondary | ICD-10-CM | POA: Diagnosis not present

## 2022-06-26 DIAGNOSIS — Z6835 Body mass index (BMI) 35.0-35.9, adult: Secondary | ICD-10-CM

## 2022-06-26 DIAGNOSIS — Z0001 Encounter for general adult medical examination with abnormal findings: Secondary | ICD-10-CM

## 2022-06-26 DIAGNOSIS — D75839 Thrombocytosis, unspecified: Secondary | ICD-10-CM | POA: Insufficient documentation

## 2022-06-26 NOTE — Assessment & Plan Note (Signed)
Encourage diet and exercise for weight loss 

## 2022-06-26 NOTE — Patient Instructions (Signed)

## 2022-06-26 NOTE — Progress Notes (Signed)
Subjective:    Patient ID: Mallory Deleon, female    DOB: 1976/10/09, 46 y.o.   MRN: [id number]  HPI  Patient presents to clinic today for her annual exam.  Flu: 06/2017 Tetanus: 03/2017 COVID: never Pap smear: Hysterectomy Mammogram: > 2 years ago Colon screening: 07/2021 Vision screening: annually Dentist: biannually  Diet: She does eat meat. She consumes fruits and veggies. She does eat some fried foods. She drinks mostly water. Exercise: Pilates, yoga  Review of Systems     Past Medical History:  Diagnosis Date   Kidney stones 2010   Thyroid disease    Wears dentures    partial upper    Current Outpatient Medications  Medication Sig Dispense Refill   fluconazole (DIFLUCAN) 150 MG tablet 1 tab p.o. x1, repeat in 3 days 2 tablet 0   metroNIDAZOLE (FLAGYL) 500 MG tablet Take 1 tablet (500 mg total) by mouth 2 (two) times daily. Do not drink alcohol while taking this medicine. 14 tablet 0   predniSONE (DELTASONE) 10 MG tablet Take 6 tabs on day 1, 5 tabs on day 2, 4 tabs on day 3, 3 tabs on day 4, 2 tabs on day 5, 1 tab on day 6 21 tablet 0   No current facility-administered medications for this visit.    Allergies  Allergen Reactions   Naproxen Sodium Itching and Rash   Sulfa Antibiotics Hives and Rash    Family History  Problem Relation Age of Onset   Diabetes Mother    Diabetes Father    Heart disease Father        CABGx3   Thyroid disease Sister        hypothyroid   Hereditary spherocytosis Daughter    Hereditary spherocytosis Son     Social History   Socioeconomic History   Marital status: Single    Spouse name: Not on file   Number of children: 3   Years of education: Not on file   Highest education level: Not on file  Occupational History   Not on file  Tobacco Use   Smoking status: Every Day    Packs/day: 0.50    Years: 25.00    Total pack years: 12.50    Types: Cigarettes   Smokeless tobacco: Never   Tobacco comments:    smokes when  she is bored and stressed  Vaping Use   Vaping Use: Every day  Substance and Sexual Activity   Alcohol use: No    Comment: rarely   Drug use: Yes    Frequency: 6.0 times per week    Types: Marijuana   Sexual activity: Yes  Other Topics Concern   Not on file  Social History Narrative   Not on file   Social Determinants of Health   Financial Resource Strain: Not on file  Food Insecurity: Not on file  Transportation Needs: Not on file  Physical Activity: Not on file  Stress: Not on file  Social Connections: Not on file  Intimate Partner Violence: Not on file     Constitutional: Denies fever, malaise, fatigue, headache or abrupt weight changes.  HEENT: Denies eye pain, eye redness, ear pain, ringing in the ears, wax buildup, runny nose, nasal congestion, bloody nose, or sore throat. Respiratory: Denies difficulty breathing, shortness of breath, cough or sputum production.   Cardiovascular: Denies chest pain, chest tightness, palpitations or swelling in the hands or feet.  Gastrointestinal: Patient reports hemorrhoid, intermittent blood in stool.  Denies abdominal pain, bloating,  constipation, diarrhea.  GU: Denies urgency, frequency, pain with urination, burning sensation, blood in urine, odor or discharge. Musculoskeletal: Denies decrease in range of motion, difficulty with gait, muscle pain or joint pain and swelling.  Skin: Denies redness, rashes, lesions or ulcercations.  Neurological: Denies dizziness, difficulty with memory, difficulty with speech or problems with balance and coordination.  Psych: Denies anxiety, depression, SI/HI.  No other specific complaints in a complete review of systems (except as listed in HPI above).  Objective:   Physical Exam  BP 138/84 (BP Location: Right Arm, Patient Position: Sitting, Cuff Size: Normal)   Pulse 81   Temp (!) 97.5 F (36.4 C) (Temporal)   Ht $R'4\' 11"'Fy$  (1.499 m)   Wt 175 lb (79.4 kg)   SpO2 98%   BMI 35.35 kg/m   Wt  Readings from Last 3 Encounters:  04/20/22 174 lb (78.9 kg)  04/10/22 172 lb (78 kg)  12/02/21 162 lb (73.5 kg)    General: Appears her stated age, obese, in NAD. Skin: Warm, dry and intact.  HEENT: Head: normal shape and size; Eyes: sclera white, no icterus, conjunctiva pink, PERRLA and EOMs intact;  Neck:  Neck supple, trachea midline. No masses, lumps or thyromegaly present.  Cardiovascular: Normal rate and rhythm. S1,S2 noted.  No murmur, rubs or gallops noted. No JVD or BLE edema. Pulmonary/Chest: Normal effort and positive vesicular breath sounds. No respiratory distress. No wheezes, rales or ronchi noted.  Abdomen: Normal bowel sounds.  Musculoskeletal: Strength 5/5 BUE/BLE.  No difficulty with gait.  Neurological: Alert and oriented. Cranial nerves II-XII grossly intact. Coordination normal.  Psychiatric: Mood and affect normal. Behavior is normal. Judgment and thought content normal.     BMET    Component Value Date/Time   NA 138 11/20/2021 1153   NA 137 02/20/2013 2253   K 4.1 11/20/2021 1153   K 4.1 02/20/2013 2253   CL 102 11/20/2021 1153   CL 107 02/20/2013 2253   CO2 27 11/20/2021 1153   CO2 24 02/20/2013 2253   GLUCOSE 114 (H) 11/20/2021 1153   GLUCOSE 96 02/20/2013 2253   BUN 11 11/20/2021 1153   BUN 9 02/20/2013 2253   CREATININE 0.52 11/20/2021 1153   CREATININE 0.66 06/20/2021 1419   CALCIUM 9.2 11/20/2021 1153   CALCIUM 9.2 02/20/2013 2253   GFRNONAA >60 11/20/2021 1153   GFRNONAA 123 06/29/2018 0827   GFRAA >60 06/14/2020 0416   GFRAA 142 06/29/2018 0827    Lipid Panel     Component Value Date/Time   CHOL 224 (H) 06/20/2021 1419   TRIG 132 06/20/2021 1419   HDL 45 (L) 06/20/2021 1419   CHOLHDL 5.0 (H) 06/20/2021 1419   VLDL 35 (H) 03/31/2017 0849   LDLCALC 153 (H) 06/20/2021 1419    CBC    Component Value Date/Time   WBC 13.3 (H) 11/20/2021 1153   RBC 4.74 11/20/2021 1153   HGB 14.6 11/20/2021 1153   HGB 14.0 02/20/2013 2253   HCT  42.1 11/20/2021 1153   HCT 41.1 02/20/2013 2253   PLT 588 (H) 11/20/2021 1153   PLT 625 (H) 02/20/2013 2253   MCV 88.8 11/20/2021 1153   MCV 72 (L) 02/20/2013 2253   MCH 30.8 11/20/2021 1153   MCHC 34.7 11/20/2021 1153   RDW 13.0 11/20/2021 1153   RDW 19.0 (H) 02/20/2013 2253   LYMPHSABS 5.7 (H) 06/14/2020 0416   MONOABS 1.4 (H) 06/14/2020 0416   EOSABS 0.3 06/14/2020 0416   BASOSABS 0.1 06/14/2020 0416  Hgb A1C Lab Results  Component Value Date   HGBA1C 5.2 06/20/2021           Assessment & Plan:   Preventative Health Maintenance:  Encouraged her to get a flu shot in the fall Tetanus UTD Encouraged her to get her COVID vaccine She no longer needs Pap smears Mammogram ordered-she will call to schedule Colon screening UTD Encouraged her to consume a balanced diet and exercise regimen Advised her to see an eye doctor and dentist annually Will check CBC, c-Met, TSH, lipid, A1c today  RTC in 1 year for your annual exam Webb Silversmith, NP

## 2022-06-27 LAB — COMPLETE METABOLIC PANEL WITH GFR
AG Ratio: 1.8 (calc) (ref 1.0–2.5)
ALT: 20 U/L (ref 6–29)
AST: 16 U/L (ref 10–35)
Albumin: 4.4 g/dL (ref 3.6–5.1)
Alkaline phosphatase (APISO): 80 U/L (ref 31–125)
BUN: 10 mg/dL (ref 7–25)
CO2: 26 mmol/L (ref 20–32)
Calcium: 9.4 mg/dL (ref 8.6–10.2)
Chloride: 104 mmol/L (ref 98–110)
Creat: 0.61 mg/dL (ref 0.50–0.99)
Globulin: 2.4 g/dL (calc) (ref 1.9–3.7)
Glucose, Bld: 135 mg/dL — ABNORMAL HIGH (ref 65–99)
Potassium: 3.9 mmol/L (ref 3.5–5.3)
Sodium: 137 mmol/L (ref 135–146)
Total Bilirubin: 0.5 mg/dL (ref 0.2–1.2)
Total Protein: 6.8 g/dL (ref 6.1–8.1)
eGFR: 112 mL/min/{1.73_m2} (ref >=60)

## 2022-06-27 LAB — CBC
HCT: 39.1 % (ref 35.0–45.0)
Hemoglobin: 13.7 g/dL (ref 11.7–15.5)
MCH: 31.1 pg (ref 27.0–33.0)
MCHC: 35 g/dL (ref 32.0–36.0)
MCV: 88.9 fL (ref 80.0–100.0)
MPV: 10.6 fL (ref 7.5–12.5)
Platelets: 564 10*3/uL — ABNORMAL HIGH (ref 140–400)
RBC: 4.4 10*6/uL (ref 3.80–5.10)
RDW: 13.4 % (ref 11.0–15.0)
WBC: 12.9 10*3/uL — ABNORMAL HIGH (ref 3.8–10.8)

## 2022-06-27 LAB — TSH: TSH: 1.25 mIU/L

## 2022-06-27 LAB — LIPID PANEL
Cholesterol: 230 mg/dL — ABNORMAL HIGH (ref <200)
HDL: 60 mg/dL (ref >=50)
LDL Cholesterol (Calc): 151 mg/dL (calc) — ABNORMAL HIGH
Non-HDL Cholesterol (Calc): 170 mg/dL (calc) — ABNORMAL HIGH (ref <130)
Total CHOL/HDL Ratio: 3.8 (calc) (ref <5.0)
Triglycerides: 89 mg/dL (ref <150)

## 2022-06-27 LAB — HEMOGLOBIN A1C
Hgb A1c MFr Bld: 5.1 % of total Hgb (ref <5.7)
Mean Plasma Glucose: 100 mg/dL
eAG (mmol/L): 5.5 mmol/L

## 2022-07-19 IMAGING — CR DG ABDOMEN 1V
1 series · 2 of 2 positions shown · non-contrast
Comparison: [facility], 11/25/2021 and 06/28/2008. CT AP, 11/20/2021 and
06/14/2020.

CLINICAL DATA: Preop.

EXAM:
ABDOMEN - 1 VIEW

[Series 1: dg abd 1 view · 0.14mm/px · 2 of 2 slices shown]
[im 1/2]
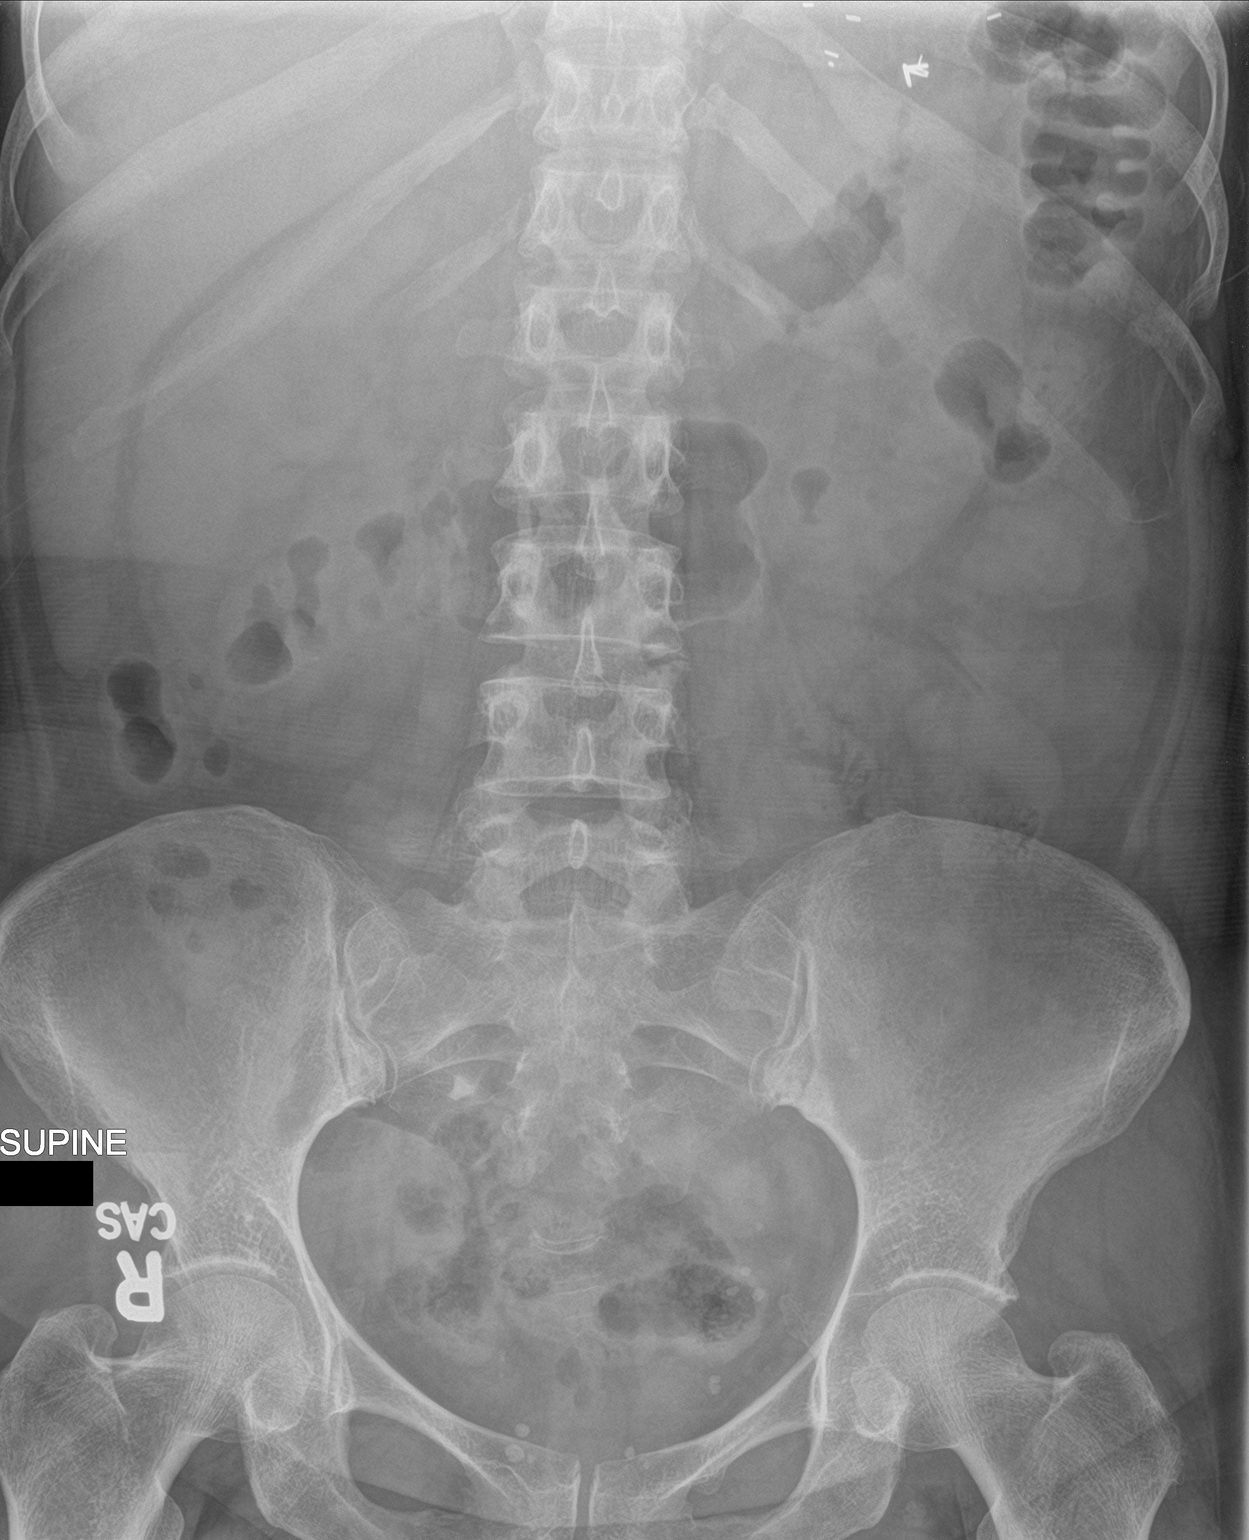
[im 2/2]
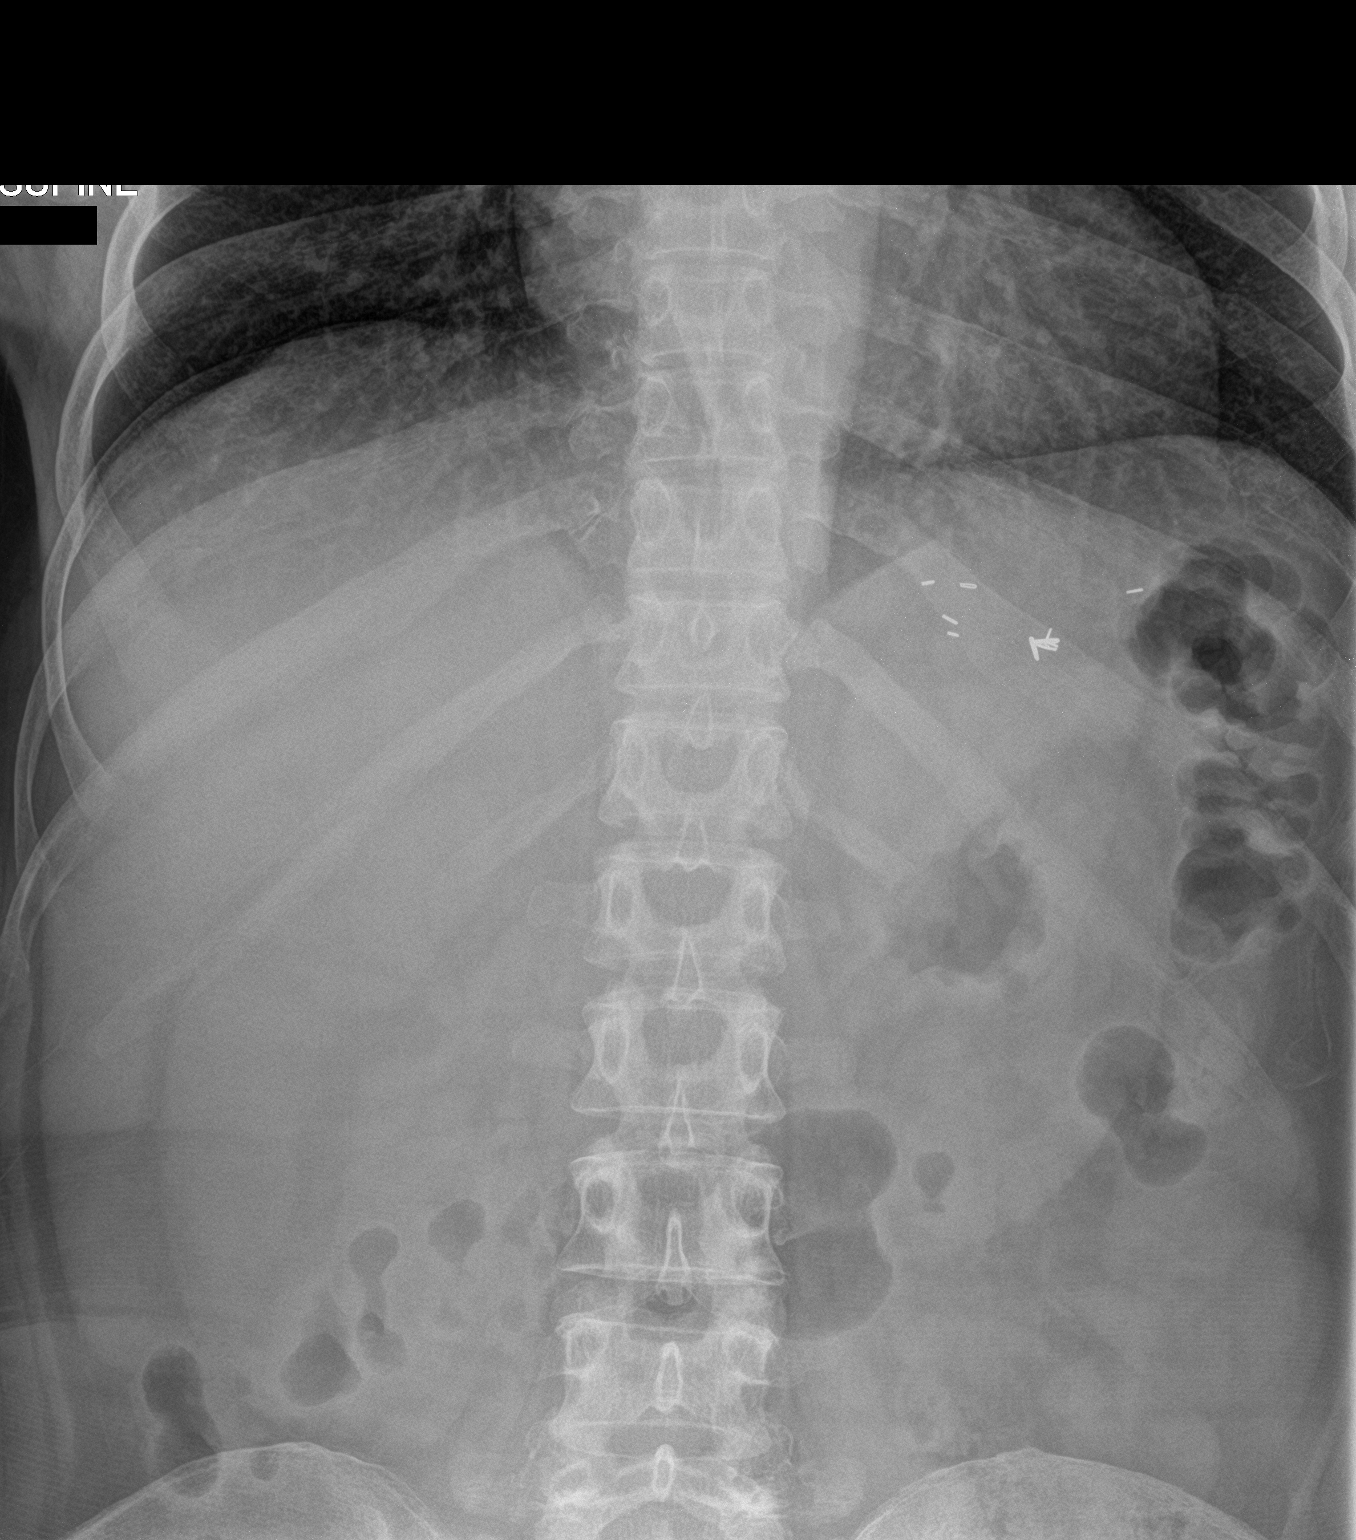

[2 of 2 positions shown; findings below may reference images not displayed]

FINDINGS: Splenectomy clips. The bowel gas pattern is normal. Known RIGHT
ureteral stone is radiolucent, and not perceived on this evaluation.
No interval osseous abnormality.
IMPRESSION: 1. Nonobstructed bowel pattern.
2. Known RIGHT ureteral stone is radiolucent, and not perceived on
this evaluation.

## 2022-07-24 IMAGING — CR DG ABDOMEN 1V
1 series · 2 of 2 positions shown · non-contrast
Comparison: 11/27/2021, 11/20/2021

CLINICAL DATA: Lithotripsy 11/27/2021, right-sided pain

EXAM:
ABDOMEN - 1 VIEW

[Series 1: dg abd 1 view · 0.14mm/px · 2 of 2 slices shown]
[im 1/2]
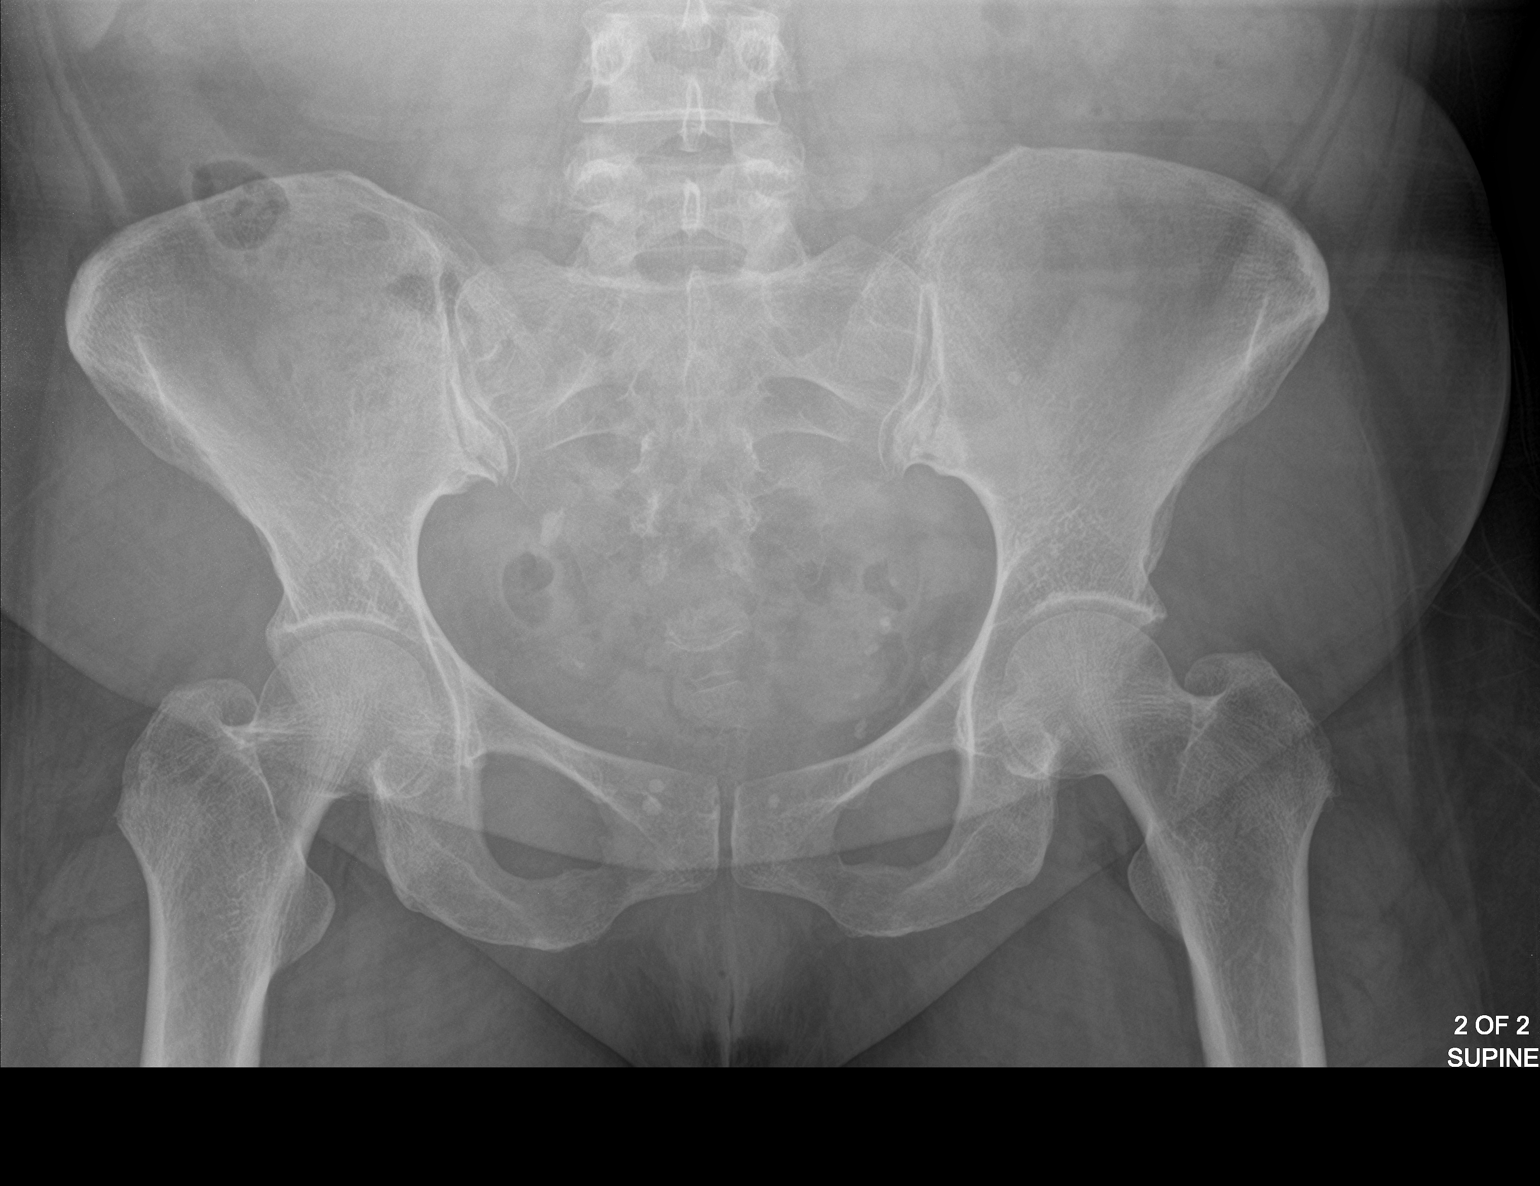
[im 2/2]
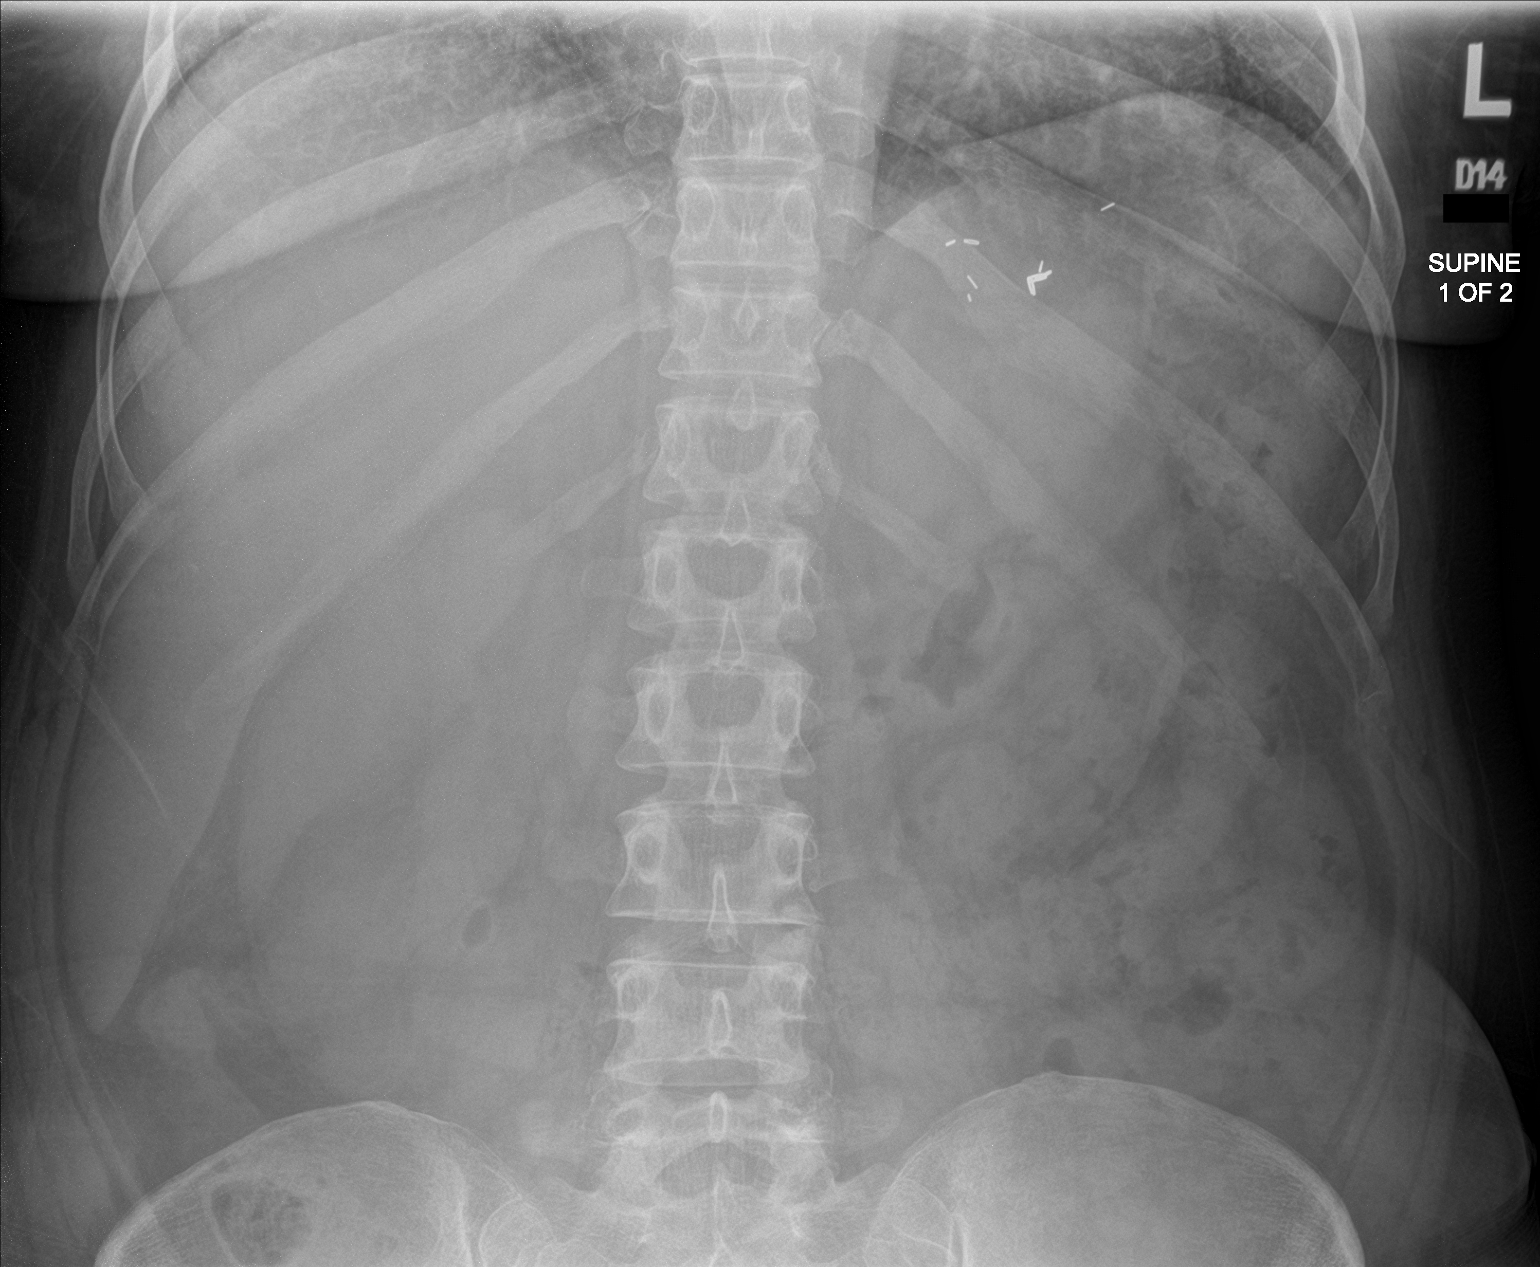

[2 of 2 positions shown; findings below may reference images not displayed]

FINDINGS: Two supine frontal views of the abdomen and pelvis are obtained. A
distal right ureteral calculus is again identified overlying the
right sacral ala, measuring 11 mm in size. There is a smaller 3 mm
calcification distal to this, likely in the region of the UVJ, new
since prior study. Numerous other pelvic phleboliths are stable. No
other urinary tract calculi. Bowel gas pattern is unremarkable.
IMPRESSION: 1. Fragmentation of the distal right ureteral calculus seen
previously, with 11 mm fragment overlying the distal right ureter at
the level of the sacral ala, and 3 mm fragment in the region of the
right UVJ.

## 2022-08-27 ENCOUNTER — Encounter: Payer: Self-pay | Admitting: Physician Assistant

## 2022-08-27 ENCOUNTER — Ambulatory Visit: Payer: No Typology Code available for payment source | Admitting: Physician Assistant

## 2022-08-27 VITALS — BP 134/84 | HR 82 | Temp 96.9°F | Wt 174.0 lb

## 2022-08-27 DIAGNOSIS — J029 Acute pharyngitis, unspecified: Secondary | ICD-10-CM | POA: Diagnosis not present

## 2022-08-27 LAB — POCT RAPID STREP A (OFFICE): Rapid Strep A Screen: NEGATIVE

## 2022-08-27 MED ORDER — METHYLPREDNISOLONE ACETATE 80 MG/ML IJ SUSP
80.0000 mg | Freq: Once | INTRAMUSCULAR | Status: AC
  Administered 2022-08-27: 80 mg via INTRAMUSCULAR

## 2022-08-27 NOTE — Patient Instructions (Addendum)
We will call you with the results of your rapid Strep. If the results are negative we will send if for culture for a definitive diagnosis. If the culture results or rapid are positive I will send you in a prescription for an antibiotic Please be mindful of your throat, if you have trouble breathing, notice you are drooling or unable to swallow- please go to the ED for evaluation as the back of your throat and tonsils are very swollen. Please continue to wear a mask until you are feeling better.

## 2022-08-27 NOTE — Assessment & Plan Note (Signed)
Acute, recurrent concern Reports sore throat, body aches, mildly increased temp, headaches since Tuesday Discussed various potential etiologies of her symptoms to include COVID, Flu, rhinovirus, Strep Centor criteria score of 0 per MDCalc  Patient declines testing for COVID today stating "I don't think it's real", offered Flu testing as well but she did not want the nasal swab for this either She requested testing for Strep throat only today- rapid was negative, will send for culture for definitive rule out Concerned for swollen tonsils so will provide Depo- Medrol 80 mg IM injection and provided return/ ED precautions verbally and in AVS  Will send abx as indicated by culture results Follow up as needed.

## 2022-08-27 NOTE — Progress Notes (Signed)
Acute Office Visit   Patient: Mallory Deleon   DOB: 1976/07/05   46 y.o. Female  MRN: [id number] Visit Date: 08/27/2022  Today's healthcare provider: Dani Gobble Vianny Schraeder, PA-C  Introduced myself to the patient as a Journalist, newspaper and provided education on APPs in clinical practice.    Chief Complaint  Patient presents with   Sore Throat    Patient reports sore throat with body aches, mildly increased temp, headaches   Subjective    HPI HPI     Sore Throat    Additional comments: Patient reports sore throat with body aches, mildly increased temp, headaches      Last edited by Almon Register, PA-C on 08/27/2022  9:59 AM.        Reports she has had a sore throat, body aches, low fever, headaches since Tues Tmax: 100.1 this morning She has taken Tylenol  Reports some productive coughing this AM She states she thinks she has Strep throat  She denies recent sick contacts to her knowledge but went to a large concert on Friday  She has not shared drinks with anyone but she did have her grandchildren over the weekend   Medications: No outpatient medications prior to visit.   No facility-administered medications prior to visit.    Review of Systems  Constitutional:  Positive for chills, diaphoresis and fever.  HENT:  Positive for congestion, rhinorrhea, sinus pressure, sinus pain, sore throat and trouble swallowing (painful to swallow). Negative for postnasal drip.   Respiratory:  Positive for cough. Negative for shortness of breath and wheezing.   Gastrointestinal:  Positive for diarrhea (more frequent). Negative for nausea and vomiting.  Musculoskeletal:  Positive for myalgias.  Neurological:  Positive for headaches.       Objective    BP 134/84 (BP Location: Left Arm, Patient Position: Sitting, Cuff Size: Normal)   Pulse 82   Temp (!) 96.9 F (36.1 C) (Temporal)   Wt 174 lb (78.9 kg)   SpO2 99%   BMI 35.14 kg/m    Physical Exam Vitals reviewed.  Constitutional:       General: She is awake.     Appearance: Normal appearance. She is well-developed and well-groomed.  HENT:     Head: Normocephalic and atraumatic.     Right Ear: Hearing, tympanic membrane, ear canal and external ear normal.     Left Ear: Hearing, tympanic membrane, ear canal and external ear normal.     Mouth/Throat:     Mouth: Mucous membranes are moist.     Pharynx: Uvula midline. Pharyngeal swelling and posterior oropharyngeal erythema present. No oropharyngeal exudate.     Tonsils: No tonsillar exudate or tonsillar abscesses. 2+ on the right. 3+ on the left.  Eyes:     Extraocular Movements: Extraocular movements intact.     Conjunctiva/sclera: Conjunctivae normal.  Cardiovascular:     Rate and Rhythm: Normal rate and regular rhythm.     Heart sounds: Normal heart sounds.  Pulmonary:     Effort: Pulmonary effort is normal.     Breath sounds: Normal breath sounds. No decreased air movement. No decreased breath sounds, wheezing, rhonchi or rales.  Musculoskeletal:     Cervical back: Normal range of motion.  Lymphadenopathy:     Head:     Right side of head: No submental, submandibular or preauricular adenopathy.     Left side of head: No submental, submandibular or preauricular adenopathy.  Cervical: Cervical adenopathy present.     Right cervical: Posterior cervical adenopathy present. No superficial cervical adenopathy.    Left cervical: No superficial or posterior cervical adenopathy.     Upper Body:     Right upper body: No supraclavicular adenopathy.     Left upper body: No supraclavicular adenopathy.  Neurological:     Mental Status: She is alert.  Psychiatric:        Behavior: Behavior is cooperative.       Results for orders placed or performed in visit on 08/27/22  POCT rapid strep A  Result Value Ref Range   Rapid Strep A Screen Negative Negative    Assessment & Plan      No follow-ups on file.       Problem List Items Addressed This Visit        Other   Sore throat - Primary    Acute, recurrent concern Reports sore throat, body aches, mildly increased temp, headaches since Tuesday Discussed various potential etiologies of her symptoms to include COVID, Flu, rhinovirus, Strep Centor criteria score of 0 per MDCalc  Patient declines testing for COVID today stating "I don't think it's real", offered Flu testing as well but she did not want the nasal swab for this either She requested testing for Strep throat only today- rapid was negative, will send for culture for definitive rule out Concerned for swollen tonsils so will provide Depo- Medrol 80 mg IM injection and provided return/ ED precautions verbally and in AVS  Will send abx as indicated by culture results Follow up as needed.       Relevant Orders   POCT rapid strep A (Completed)   Culture, Group A Strep     No follow-ups on file.   I, Sidhant Helderman E Nasim Habeeb, PA-C, have reviewed all documentation for this visit. The documentation on 08/27/22 for the exam, diagnosis, procedures, and orders are all accurate and complete.   Talitha Givens, MHS, PA-C [facility]

## 2022-08-29 LAB — CULTURE, GROUP A STREP
MICRO NUMBER:: 14047547
SPECIMEN QUALITY:: ADEQUATE

## 2023-01-22 ENCOUNTER — Encounter: Payer: Self-pay | Admitting: Internal Medicine

## 2023-01-22 ENCOUNTER — Ambulatory Visit: Payer: No Typology Code available for payment source | Admitting: Internal Medicine

## 2023-01-22 VITALS — BP 124/74 | HR 85 | Temp 96.8°F | Wt 178.0 lb

## 2023-01-22 DIAGNOSIS — D75839 Thrombocytosis, unspecified: Secondary | ICD-10-CM

## 2023-01-22 DIAGNOSIS — D72829 Elevated white blood cell count, unspecified: Secondary | ICD-10-CM

## 2023-01-22 DIAGNOSIS — B001 Herpesviral vesicular dermatitis: Secondary | ICD-10-CM

## 2023-01-22 DIAGNOSIS — E039 Hypothyroidism, unspecified: Secondary | ICD-10-CM

## 2023-01-22 DIAGNOSIS — E66812 Obesity, class 2: Secondary | ICD-10-CM

## 2023-01-22 DIAGNOSIS — E7800 Pure hypercholesterolemia, unspecified: Secondary | ICD-10-CM

## 2023-01-22 DIAGNOSIS — Z6835 Body mass index (BMI) 35.0-35.9, adult: Secondary | ICD-10-CM

## 2023-01-22 MED ORDER — VALACYCLOVIR HCL 1 G PO TABS: 2000.0000 mg | ORAL_TABLET | Freq: Every day | ORAL | 0 refills | Status: DC | PRN

## 2023-01-22 NOTE — Assessment & Plan Note (Signed)
CBC today.  

## 2023-01-22 NOTE — Patient Instructions (Signed)
Cold Sore  A cold sore, also called a fever blister, is a small, fluid-filled sore that forms inside the mouth or on the lips, gums, nose, chin, or cheeks. Cold sores can spread to other parts of the body, such as the eyes, fingers, or genitals. Cold sores can spread from person to person (are contagious) until the sores crust over completely. Most cold sores go away within 2 weeks. What are the causes? Cold sores are caused by an infection from a common type of herpes simplex virus (HSV-1). HSV-1 is closely related to the HSV-2virus, which is the virus that causes genital herpes, but these viruses are not the same. Once a person is infected with HSV-1, the virus remains permanently in the body. HSV-1 is spread from person to person through close contact, such as through kissing, touching the affected area, or sharing personal items such as lip balm, razors, a drinking glass, or eating utensils. What increases the risk? You are more likely to develop this condition if you: Are tired, stressed, or sick. Are menstruating. Are pregnant. Take certain medicines. Are exposed to cold weather or too much sun. What are the signs or symptoms? Symptoms of a cold sore outbreak go through different stages. These are the stages of a cold sore: Tingling, itching, or burning is felt 1-2 days before the outbreak. Fluid-filled blisters appear on the lips, inside the mouth, on the nose, or on the cheeks. The blisters start to ooze clear fluid. The blisters dry up, and a yellow crust appears in their place. The crust falls off. In some cases, other symptoms can develop during a cold sore outbreak. These can include: Fever. Sore throat. Headache. Muscle aches. Swollen neck glands. How is this diagnosed? This condition is diagnosed based on your medical history and a physical exam. Your health care provider may do a blood test or may swab some fluid from your sore and then examine the swab in the lab. How is  this treated? There is no cure for cold sores or HSV-1. There is also no vaccine for HSV-1. Most cold sores go away on their own without treatment within 2 weeks. Medicines cannot make the infection go away, but your health care provider may prescribe medicines to: Help relieve some of the pain associated with the sores. Work to stop the virus from multiplying. Shorten healing time. Medicines may be in the form of creams, gels, pills, or a shot. Follow these instructions at home: Medicines Take or apply over-the-counter and prescription medicines only as told by your health care provider. Use a cotton-tip swab to apply creams or gels to your sores. Ask your health care provider if you can take lysine supplements. Research has found that lysine may help heal the cold sore faster and prevent outbreaks. Sore care  Do not touch the sores or pick the scabs. Wash your hands often with soap and water for at least 20 seconds. Do not touch your eyes without washing your hands first. Keep the sores clean and dry. If directed, put ice on the sores. To do this: Put ice in a plastic bag. Place a towel between your skin and the bag. Leave the ice on for 20 minutes, 2-3 times a day. Remove the ice if your skin turns bright red. This is very important. If you cannot feel pain, heat, or cold, you have a greater risk of damage to the area. Eating and drinking Eat a soft, bland diet. Avoid eating hot, cold, or salty foods.   Use a straw if it hurts to drink out of a glass. Eat foods that are rich in lysine, such as meat, fish, and dairy products. Avoid sugary foods, chocolates, nuts, and grains. These foods are rich in a nutrient called arginine, which can cause the virus to multiply. Lifestyle Do not kiss, have oral sex, or share personal items until your sores heal. Stress, poor sleep, and being out in the sun can trigger outbreaks. Make sure you: Do activities that help you relax, such as deep breathing  exercises or meditation. Get enough sleep. Apply sunscreen on your lips before you go out in the sun. Contact a health care provider if: You have symptoms for more than 2 weeks. You have pus coming from the sores. You have redness that is spreading. You have pain or irritation in your eye. You get sores on your genitals. Your sores do not heal within 2 weeks. You have frequent cold sore outbreaks. Get help right away if: You have a fever and your symptoms suddenly get worse. You have a headache and confusion. You have tiredness (fatigue) or loss of appetite. You have a stiff neck or sensitivity to light. Summary A cold sore, also called a fever blister, is a small, fluid-filled sore that forms inside the mouth or on the lips, gums, nose, chin, or cheeks. Most cold sores go away on their own without treatment within 2 weeks. Your health care provider may prescribe medicines to help relieve some of the pain, work to stop the virus from multiplying, and shorten healing time. Wash your hands often with soap and water for at least 20 seconds. Do not touch your eyes without washing your hands first. Do not kiss, have oral sex, or share personal items until your sores heal. Contact a health care provider if your sores do not heal within 2 weeks. This information is not intended to replace advice given to you by your health care provider. Make sure you discuss any questions you have with your health care provider. Document Revised: 08/13/2021 Document Reviewed: 08/13/2021 Elsevier Patient Education  2023 [employer]vier Inc.  

## 2023-01-22 NOTE — Assessment & Plan Note (Signed)
Rx for valacyclovir 2 g daily x 1 at the first sign of an outbreak

## 2023-01-22 NOTE — Progress Notes (Signed)
Subjective:    Patient ID: Mallory Deleon, female    DOB: 09-13-1976, 47 y.o.   MRN: [id number]  HPI  Patient presents to clinic today for follow-up of chronic conditions.  Hypothyroidism: Her last TSH level was normal.  She is not taking levothyroxine at this time.  She does not follow with endocrinology.  Leukocytosis: Her last WBC count was 12.9, 06/2022.  She does smoke.  She does not follow with hematology.  Thrombocytosis: Her last platelet count was 564, 06/2022.  She does not follow with hematology.  HLD: Her last LDL was 151, triglycerides 89, 06/2022.  She is not taking any cholesterol-lowering medication at this time.  She does not consume a low-fat diet.  She also reports cold sore on her nose. She has had this in the past. She has tried Abreva OTC with minimal relief of symptoms. She would like a RX for Valtrex.  Review of Systems     Past Medical History:  Diagnosis Date   Kidney stones 2010   Thyroid disease    Wears dentures    partial upper    No current outpatient medications on file.   No current facility-administered medications for this visit.    Allergies  Allergen Reactions   Naproxen Sodium Itching and Rash   Sulfa Antibiotics Hives and Rash    Family History  Problem Relation Age of Onset   Diabetes Mother    Diabetes Father    Heart disease Father        CABGx3   Thyroid disease Sister        hypothyroid   Hereditary spherocytosis Daughter    Hereditary spherocytosis Son     Social History   Socioeconomic History   Marital status: Single    Spouse name: Not on file   Number of children: 3   Years of education: Not on file   Highest education level: Not on file  Occupational History   Not on file  Tobacco Use   Smoking status: Every Day    Packs/day: 0.50    Years: 25.00    Total pack years: 12.50    Types: Cigarettes   Smokeless tobacco: Never   Tobacco comments:    smokes when she is bored and stressed  Vaping Use    Vaping Use: Every day  Substance and Sexual Activity   Alcohol use: No    Comment: rarely   Drug use: Yes    Frequency: 6.0 times per week    Types: Marijuana   Sexual activity: Yes  Other Topics Concern   Not on file  Social History Narrative   Not on file   Social Determinants of Health   Financial Resource Strain: Not on file  Food Insecurity: Not on file  Transportation Needs: Not on file  Physical Activity: Not on file  Stress: Not on file  Social Connections: Not on file  Intimate Partner Violence: Not on file     Constitutional: Denies fever, malaise, fatigue, headache or abrupt weight changes.  HEENT: Denies eye pain, eye redness, ear pain, ringing in the ears, wax buildup, runny nose, nasal congestion, bloody nose, or sore throat. Respiratory: Denies difficulty breathing, shortness of breath, cough or sputum production.   Cardiovascular: Denies chest pain, chest tightness, palpitations or swelling in the hands or feet.  Gastrointestinal: Denies abdominal pain, bloating, constipation, diarrhea or blood in the stool.  GU: Denies urgency, frequency, pain with urination, burning sensation, blood in urine, odor or discharge.  Musculoskeletal: Denies decrease in range of motion, difficulty with gait, muscle pain or joint pain and swelling.  Skin: Patient reports ulceration of nose.  Denies redness, rashes, lesions.  Neurological: Denies dizziness, difficulty with memory, difficulty with speech or problems with balance and coordination.  Psych: Denies anxiety, depression, SI/HI.  No other specific complaints in a complete review of systems (except as listed in HPI above).  Objective:   Physical Exam  BP 124/74 (BP Location: Left Arm, Patient Position: Sitting, Cuff Size: Large)   Pulse 85   Temp (!) 96.8 F (36 C) (Temporal)   Wt 178 lb (80.7 kg)   SpO2 96%   BMI 35.95 kg/m   Wt Readings from Last 3 Encounters:  08/27/22 174 lb (78.9 kg)  06/26/22 175 lb (79.4 kg)   04/20/22 174 lb (78.9 kg)    General: Appears her stated age, obese, in NAD. Skin: Scabbed area noted of inferior nasal bridge. HEENT: Head: normal shape and size; Eyes: sclera white, no icterus, conjunctiva pink, PERRLA and EOMs intact;  Neck:  Neck supple, trachea midline. No masses, lumps or thyromegaly present.  Cardiovascular: Normal rate and rhythm. S1,S2 noted.  No murmur, rubs or gallops noted. No JVD or BLE edema.  Pulmonary/Chest: Normal effort and positive vesicular breath sounds. No respiratory distress. No wheezes, rales or ronchi noted.  Musculoskeletal: No difficulty with gait.  Neurological: Alert and oriented. Coordination normal.    BMET    Component Value Date/Time   NA 137 06/26/2022 1351   NA 137 02/20/2013 2253   K 3.9 06/26/2022 1351   K 4.1 02/20/2013 2253   CL 104 06/26/2022 1351   CL 107 02/20/2013 2253   CO2 26 06/26/2022 1351   CO2 24 02/20/2013 2253   GLUCOSE 135 (H) 06/26/2022 1351   GLUCOSE 96 02/20/2013 2253   BUN 10 06/26/2022 1351   BUN 9 02/20/2013 2253   CREATININE 0.61 06/26/2022 1351   CALCIUM 9.4 06/26/2022 1351   CALCIUM 9.2 02/20/2013 2253   GFRNONAA >60 11/20/2021 1153   GFRNONAA 123 06/29/2018 0827   GFRAA >60 06/14/2020 0416   GFRAA 142 06/29/2018 0827    Lipid Panel     Component Value Date/Time   CHOL 230 (H) 06/26/2022 1351   TRIG 89 06/26/2022 1351   HDL 60 06/26/2022 1351   CHOLHDL 3.8 06/26/2022 1351   VLDL 35 (H) 03/31/2017 0849   LDLCALC 151 (H) 06/26/2022 1351    CBC    Component Value Date/Time   WBC 12.9 (H) 06/26/2022 1351   RBC 4.40 06/26/2022 1351   HGB 13.7 06/26/2022 1351   HGB 14.0 02/20/2013 2253   HCT 39.1 06/26/2022 1351   HCT 41.1 02/20/2013 2253   PLT 564 (H) 06/26/2022 1351   PLT 625 (H) 02/20/2013 2253   MCV 88.9 06/26/2022 1351   MCV 72 (L) 02/20/2013 2253   MCH 31.1 06/26/2022 1351   MCHC 35.0 06/26/2022 1351   RDW 13.4 06/26/2022 1351   RDW 19.0 (H) 02/20/2013 2253   LYMPHSABS 5.7  (H) 06/14/2020 0416   MONOABS 1.4 (H) 06/14/2020 0416   EOSABS 0.3 06/14/2020 0416   BASOSABS 0.1 06/14/2020 0416    Hgb A1C Lab Results  Component Value Date   HGBA1C 5.1 06/26/2022            Assessment & Plan:     RTC in 6 months for your annual exam Webb Silversmith, NP

## 2023-01-22 NOTE — Assessment & Plan Note (Signed)
Encourage diet and exercise for weight loss 

## 2023-01-22 NOTE — Assessment & Plan Note (Signed)
C-Met and lipid profile today °Encouraged her to consume a low-fat diet °

## 2023-01-22 NOTE — Assessment & Plan Note (Signed)
Will check TSH yearly at annual exam

## 2023-01-23 LAB — COMPLETE METABOLIC PANEL WITH GFR
AG Ratio: 1.6 (calc) (ref 1.0–2.5)
ALT: 11 U/L (ref 6–29)
AST: 12 U/L (ref 10–35)
Albumin: 4.6 g/dL (ref 3.6–5.1)
Alkaline phosphatase (APISO): 85 U/L (ref 31–125)
BUN: 19 mg/dL (ref 7–25)
CO2: 24 mmol/L (ref 20–32)
Calcium: 9.9 mg/dL (ref 8.6–10.2)
Chloride: 105 mmol/L (ref 98–110)
Creat: 0.67 mg/dL (ref 0.50–0.99)
Globulin: 2.8 g/dL (calc) (ref 1.9–3.7)
Glucose, Bld: 110 mg/dL — ABNORMAL HIGH (ref 65–99)
Potassium: 4.6 mmol/L (ref 3.5–5.3)
Sodium: 139 mmol/L (ref 135–146)
Total Bilirubin: 0.7 mg/dL (ref 0.2–1.2)
Total Protein: 7.4 g/dL (ref 6.1–8.1)
eGFR: 109 mL/min/{1.73_m2} (ref >=60)

## 2023-01-23 LAB — LIPID PANEL
Cholesterol: 245 mg/dL — ABNORMAL HIGH (ref <200)
HDL: 53 mg/dL (ref >=50)
LDL Cholesterol (Calc): 163 mg/dL (calc) — ABNORMAL HIGH
Non-HDL Cholesterol (Calc): 192 mg/dL (calc) — ABNORMAL HIGH (ref <130)
Total CHOL/HDL Ratio: 4.6 (calc) (ref <5.0)
Triglycerides: 146 mg/dL (ref <150)

## 2023-01-23 LAB — CBC
HCT: 41.9 % (ref 35.0–45.0)
Hemoglobin: 14.1 g/dL (ref 11.7–15.5)
MCH: 29.4 pg (ref 27.0–33.0)
MCHC: 33.7 g/dL (ref 32.0–36.0)
MCV: 87.5 fL (ref 80.0–100.0)
MPV: 10.3 fL (ref 7.5–12.5)
Platelets: 739 10*3/uL — ABNORMAL HIGH (ref 140–400)
RBC: 4.79 10*6/uL (ref 3.80–5.10)
RDW: 13.4 % (ref 11.0–15.0)
WBC: 12.1 10*3/uL — ABNORMAL HIGH (ref 3.8–10.8)

## 2023-07-02 ENCOUNTER — Encounter: Payer: Self-pay | Admitting: Internal Medicine

## 2023-07-02 ENCOUNTER — Ambulatory Visit (INDEPENDENT_AMBULATORY_CARE_PROVIDER_SITE_OTHER): Payer: No Typology Code available for payment source | Admitting: Internal Medicine

## 2023-07-02 VITALS — BP 134/86 | HR 77 | Temp 96.5°F | Ht 59.0 in | Wt 180.0 lb

## 2023-07-02 DIAGNOSIS — R7309 Other abnormal glucose: Secondary | ICD-10-CM | POA: Diagnosis not present

## 2023-07-02 DIAGNOSIS — Z6836 Body mass index (BMI) 36.0-36.9, adult: Secondary | ICD-10-CM

## 2023-07-02 DIAGNOSIS — Z0001 Encounter for general adult medical examination with abnormal findings: Secondary | ICD-10-CM | POA: Diagnosis not present

## 2023-07-02 DIAGNOSIS — Z1231 Encounter for screening mammogram for malignant neoplasm of breast: Secondary | ICD-10-CM

## 2023-07-02 DIAGNOSIS — E7800 Pure hypercholesterolemia, unspecified: Secondary | ICD-10-CM | POA: Diagnosis not present

## 2023-07-02 DIAGNOSIS — E039 Hypothyroidism, unspecified: Secondary | ICD-10-CM | POA: Diagnosis not present

## 2023-07-02 NOTE — Progress Notes (Signed)
Subjective:    Patient ID: Mallory Deleon, female    DOB: 1976-08-10, 47 y.o.   MRN: [id number]  HPI  Patient presents to clinic today for her annual exam.  Flu: 06/2017 Tetanus: 03/2017 COVID: Never Pap smear: Hysterectomy Mammogram: >2 years ago Colon screening: 07/2021 Vision screening: annually Dentist: biannually  Diet: She does eat meat. She consumes fruits and veggies. She does eat some fried foods. She drinks mostly coffee, water, soda Exercise: Yoga  Review of Systems     Past Medical History:  Diagnosis Date   Kidney stones 2010   Thyroid disease    Wears dentures    partial upper    Current Outpatient Medications  Medication Sig Dispense Refill   valACYclovir (VALTREX) 1000 MG tablet Take 2 tablets (2,000 mg total) by mouth daily as needed. For shingles 30 tablet 0   No current facility-administered medications for this visit.    Allergies  Allergen Reactions   Naproxen Sodium Itching and Rash   Sulfa Antibiotics Hives and Rash    Family History  Problem Relation Age of Onset   Diabetes Mother    Diabetes Father    Heart disease Father        CABGx3   Thyroid disease Sister        hypothyroid   Hereditary spherocytosis Daughter    Hereditary spherocytosis Son     Social History   Socioeconomic History   Marital status: Single    Spouse name: Not on file   Number of children: 3   Years of education: Not on file   Highest education level: Not on file  Occupational History   Not on file  Tobacco Use   Smoking status: Former    Current packs/day: 0.50    Average packs/day: 0.5 packs/day for 25.0 years (12.5 ttl pk-yrs)    Types: Cigarettes   Smokeless tobacco: Never   Tobacco comments:    smokes when she is bored and stressed  Vaping Use   Vaping status: Every Day  Substance and Sexual Activity   Alcohol use: No    Comment: rarely   Drug use: Yes    Frequency: 6.0 times per week    Types: Marijuana   Sexual activity: Yes  Other  Topics Concern   Not on file  Social History Narrative   Not on file   Social Determinants of Health   Financial Resource Strain: Not on file  Food Insecurity: Not on file  Transportation Needs: Not on file  Physical Activity: Not on file  Stress: Not on file  Social Connections: Not on file  Intimate Partner Violence: Not on file     Constitutional: Denies fever, malaise, fatigue, headache or abrupt weight changes.  HEENT: Denies eye pain, eye redness, ear pain, ringing in the ears, wax buildup, runny nose, nasal congestion, bloody nose, or sore throat. Respiratory: Denies difficulty breathing, shortness of breath, cough or sputum production.   Cardiovascular: Denies chest pain, chest tightness, palpitations or swelling in the hands or feet.  Gastrointestinal: Pt reports intermittent reflux. Denies abdominal pain, bloating, constipation, diarrhea or blood in the stool.  GU: Denies urgency, frequency, pain with urination, burning sensation, blood in urine, odor or discharge. Musculoskeletal: Denies decrease in range of motion, difficulty with gait, muscle pain or joint pain and swelling.  Skin: Denies redness, rashes, lesions or ulcercations.  Neurological: Denies dizziness, difficulty with memory, difficulty with speech or problems with balance and coordination.  Psych: Denies anxiety, depression,  SI/HI.  No other specific complaints in a complete review of systems (except as listed in HPI above).  Objective:   Physical Exam  BP 134/86 (BP Location: Right Arm, Patient Position: Sitting, Cuff Size: Normal)   Pulse 77   Temp (!) 96.5 F (35.8 C) (Temporal)   Ht 4\' 11"  (1.499 m)   Wt 180 lb (81.6 kg)   SpO2 99%   BMI 36.36 kg/m   Wt Readings from Last 3 Encounters:  01/22/23 178 lb (80.7 kg)  08/27/22 174 lb (78.9 kg)  06/26/22 175 lb (79.4 kg)    General: Appears her stated age, obese, in NAD. Skin: Warm, dry and intact.  HEENT: Head: normal shape and size; Eyes:  sclera white, no icterus, conjunctiva pink, PERRLA and EOMs intact;  Neck:  Neck supple, trachea midline.  Thyromegaly noted. Cardiovascular: Normal rate and rhythm. S1,S2 noted.  No murmur, rubs or gallops noted. No JVD or BLE edema. Pulmonary/Chest: Normal effort and positive vesicular breath sounds. No respiratory distress. No wheezes, rales or ronchi noted.  Abdomen:  Normal bowel sounds.  Musculoskeletal: Strength 5/5 BUE/BLE.  No difficulty with gait.  Neurological: Alert and oriented. Cranial nerves II-XII grossly intact. Coordination normal.  Psychiatric: Mood and affect normal. Behavior is normal. Judgment and thought content normal.    BMET    Component Value Date/Time   NA 139 01/22/2023 0931   NA 137 02/20/2013 2253   K 4.6 01/22/2023 0931   K 4.1 02/20/2013 2253   CL 105 01/22/2023 0931   CL 107 02/20/2013 2253   CO2 24 01/22/2023 0931   CO2 24 02/20/2013 2253   GLUCOSE 110 (H) 01/22/2023 0931   GLUCOSE 96 02/20/2013 2253   BUN 19 01/22/2023 0931   BUN 9 02/20/2013 2253   CREATININE 0.67 01/22/2023 0931   CALCIUM 9.9 01/22/2023 0931   CALCIUM 9.2 02/20/2013 2253   GFRNONAA >60 11/20/2021 1153   GFRNONAA 123 06/29/2018 0827   GFRAA >60 06/14/2020 0416   GFRAA 142 06/29/2018 0827    Lipid Panel     Component Value Date/Time   CHOL 245 (H) 01/22/2023 0931   TRIG 146 01/22/2023 0931   HDL 53 01/22/2023 0931   CHOLHDL 4.6 01/22/2023 0931   VLDL 35 (H) 03/31/2017 0849   LDLCALC 163 (H) 01/22/2023 0931    CBC    Component Value Date/Time   WBC 12.1 (H) 01/22/2023 0931   RBC 4.79 01/22/2023 0931   HGB 14.1 01/22/2023 0931   HGB 14.0 02/20/2013 2253   HCT 41.9 01/22/2023 0931   HCT 41.1 02/20/2013 2253   PLT 739 (H) 01/22/2023 0931   PLT 625 (H) 02/20/2013 2253   MCV 87.5 01/22/2023 0931   MCV 72 (L) 02/20/2013 2253   MCH 29.4 01/22/2023 0931   MCHC 33.7 01/22/2023 0931   RDW 13.4 01/22/2023 0931   RDW 19.0 (H) 02/20/2013 2253   LYMPHSABS 5.7 (H)  06/14/2020 0416   MONOABS 1.4 (H) 06/14/2020 0416   EOSABS 0.3 06/14/2020 0416   BASOSABS 0.1 06/14/2020 0416    Hgb A1C Lab Results  Component Value Date   HGBA1C 5.1 06/26/2022           Assessment & Plan:   Preventative health maintenance:  Encouraged her to get a flu shot in fall Tetanus UTD Encouraged her to get her COVID-vaccine She no longer needs to screen for cervical cancer Mammogram ordered previously-she just needs to call and schedule this Colon screening UTD Encouraged her  to consume a balanced diet and exercise regimen Advised her to see an eye doctor and dentist annually We will check CBC, c-Met, TSH, lipid, A1c  RTC in 6 months, follow-up chronic conditions Nicki Reaper, NP

## 2023-07-02 NOTE — Assessment & Plan Note (Signed)
Encourage diet and exercise for weight loss 

## 2023-07-02 NOTE — Patient Instructions (Signed)

## 2023-07-03 LAB — CBC
HCT: 41.5 % (ref 35.0–45.0)
Hemoglobin: 14.1 g/dL (ref 11.7–15.5)
MCH: 29.8 pg (ref 27.0–33.0)
MCHC: 34 g/dL (ref 32.0–36.0)
MCV: 87.7 fL (ref 80.0–100.0)
MPV: 10.3 fL (ref 7.5–12.5)
Platelets: 657 10*3/uL — ABNORMAL HIGH (ref 140–400)
RBC: 4.73 10*6/uL (ref 3.80–5.10)
RDW: 13.8 % (ref 11.0–15.0)
WBC: 9.4 10*3/uL (ref 3.8–10.8)

## 2023-07-03 LAB — COMPLETE METABOLIC PANEL WITH GFR
AG Ratio: 1.7 (calc) (ref 1.0–2.5)
ALT: 19 U/L (ref 6–29)
AST: 15 U/L (ref 10–35)
Albumin: 4.5 g/dL (ref 3.6–5.1)
Alkaline phosphatase (APISO): 96 U/L (ref 31–125)
BUN: 10 mg/dL (ref 7–25)
CO2: 25 mmol/L (ref 20–32)
Calcium: 9.9 mg/dL (ref 8.6–10.2)
Chloride: 103 mmol/L (ref 98–110)
Creat: 0.57 mg/dL (ref 0.50–0.99)
Globulin: 2.6 g/dL (ref 1.9–3.7)
Glucose, Bld: 118 mg/dL — ABNORMAL HIGH (ref 65–99)
Potassium: 4.8 mmol/L (ref 3.5–5.3)
Sodium: 138 mmol/L (ref 135–146)
Total Bilirubin: 0.8 mg/dL (ref 0.2–1.2)
Total Protein: 7.1 g/dL (ref 6.1–8.1)
eGFR: 113 mL/min/{1.73_m2} (ref >=60)

## 2023-07-03 LAB — LIPID PANEL
Cholesterol: 203 mg/dL — ABNORMAL HIGH (ref <200)
HDL: 57 mg/dL (ref >=50)
LDL Cholesterol (Calc): 128 mg/dL — ABNORMAL HIGH
Non-HDL Cholesterol (Calc): 146 mg/dL — ABNORMAL HIGH (ref <130)
Total CHOL/HDL Ratio: 3.6 (calc) (ref <5.0)
Triglycerides: 79 mg/dL (ref <150)

## 2023-07-03 LAB — TSH: TSH: 1.69 mIU/L

## 2023-07-03 LAB — HEMOGLOBIN A1C
Hgb A1c MFr Bld: 5.5 %{Hb} (ref <5.7)
Mean Plasma Glucose: 111 mg/dL
eAG (mmol/L): 6.2 mmol/L

## 2023-07-23 ENCOUNTER — Ambulatory Visit
Admission: RE | Admit: 2023-07-23 | Discharge: 2023-07-23 | Disposition: A | Discharge status: Home / Self Care | Payer: No Typology Code available for payment source | Source: Ambulatory Visit | Attending: Internal Medicine | Admitting: Internal Medicine

## 2023-07-23 DIAGNOSIS — Z1231 Encounter for screening mammogram for malignant neoplasm of breast: Secondary | ICD-10-CM | POA: Diagnosis present

## 2023-07-26 NOTE — Group Note (Deleted)

## 2023-12-31 ENCOUNTER — Ambulatory Visit: Payer: No Typology Code available for payment source | Admitting: Internal Medicine

## 2023-12-31 ENCOUNTER — Encounter: Payer: Self-pay | Admitting: Internal Medicine

## 2023-12-31 VITALS — BP 128/82 | Ht 59.0 in | Wt 184.0 lb

## 2023-12-31 DIAGNOSIS — D75839 Thrombocytosis, unspecified: Secondary | ICD-10-CM

## 2023-12-31 DIAGNOSIS — R739 Hyperglycemia, unspecified: Secondary | ICD-10-CM

## 2023-12-31 DIAGNOSIS — E039 Hypothyroidism, unspecified: Secondary | ICD-10-CM | POA: Diagnosis not present

## 2023-12-31 DIAGNOSIS — M25561 Pain in right knee: Secondary | ICD-10-CM

## 2023-12-31 DIAGNOSIS — E7800 Pure hypercholesterolemia, unspecified: Secondary | ICD-10-CM

## 2023-12-31 DIAGNOSIS — E66812 Obesity, class 2: Secondary | ICD-10-CM

## 2023-12-31 DIAGNOSIS — B001 Herpesviral vesicular dermatitis: Secondary | ICD-10-CM

## 2023-12-31 DIAGNOSIS — G8929 Other chronic pain: Secondary | ICD-10-CM | POA: Insufficient documentation

## 2023-12-31 DIAGNOSIS — Z6837 Body mass index (BMI) 37.0-37.9, adult: Secondary | ICD-10-CM

## 2023-12-31 NOTE — Assessment & Plan Note (Signed)
C-Met and lipid profile today Encouraged her to consume a low-fat diet

## 2023-12-31 NOTE — Progress Notes (Signed)
Subjective:    Patient ID: Mallory Deleon, female    DOB: 1976/08/31, 48 y.o.   MRN: [id number]  HPI  Patient presents to clinic today for follow-up of chronic conditions.  Hypothyroidism:  She is not taking levothyroxine at this time.  She does not follow with endocrinology.  Thrombocytosis: Her last platelet count was 657, 06/2023.  She does not follow with hematology.  HLD: Her last LDL was 128, triglycerides 79, 06/2023.  She is not taking any cholesterol-lowering medication at this time.  She does not consume a low-fat diet.  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  She also reports right knee pain.  This has been an ongoing issue.  She takes Tylenol and ibuprofen OTC as needed with some relief of symptoms.  She does not follow with orthopedics.  Review of Systems     Past Medical History:  Diagnosis Date   Kidney stones 2010   Thyroid disease    Wears dentures    partial upper    No current outpatient medications on file.   No current facility-administered medications for this visit.    Allergies  Allergen Reactions   Naproxen Sodium Itching and Rash   Sulfa Antibiotics Hives and Rash    Family History  Problem Relation Age of Onset   Diabetes Mother    Diabetes Father    Heart disease Father        CABGx3   Hypothyroidism Sister    Obesity Sister    Hereditary spherocytosis Daughter    Hereditary spherocytosis Son    Breast cancer Neg Hx     Social History   Socioeconomic History   Marital status: Single    Spouse name: Not on file   Number of children: 3   Years of education: Not on file   Highest education level: Not on file  Occupational History   Not on file  Tobacco Use   Smoking status: Former    Current packs/day: 0.50    Average packs/day: 0.5 packs/day for 25.0 years (12.5 ttl pk-yrs)    Types: Cigarettes   Smokeless tobacco: Never   Tobacco comments:    smokes when she is bored and stressed  Vaping Use    Vaping status: Never Used  Substance and Sexual Activity   Alcohol use: No    Comment: rarely   Drug use: Yes    Frequency: 6.0 times per week    Types: Marijuana   Sexual activity: Yes  Other Topics Concern   Not on file  Social History Narrative   Not on file   Social Drivers of Health   Financial Resource Strain: Not on file  Food Insecurity: Not on file  Transportation Needs: Not on file  Physical Activity: Not on file  Stress: Not on file  Social Connections: Not on file  Intimate Partner Violence: Not on file     Constitutional: Denies fever, malaise, fatigue, headache or abrupt weight changes.  HEENT: Denies eye pain, eye redness, ear pain, ringing in the ears, wax buildup, runny nose, nasal congestion, bloody nose, or sore throat. Respiratory: Denies difficulty breathing, shortness of breath, cough or sputum production.   Cardiovascular: Denies chest pain, chest tightness, palpitations or swelling in the hands or feet.  Gastrointestinal: Denies abdominal pain, bloating, constipation, diarrhea or blood in the stool.  GU: Denies urgency, frequency, pain with urination, burning sensation, blood in urine, odor or discharge. Musculoskeletal: Patient reports right knee pain.  Denies decrease in  range of motion, difficulty with gait, muscle pain or joint swelling.  Skin: Denies redness, rashes, lesions or ulcerations.  Neurological: Denies dizziness, difficulty with memory, difficulty with speech or problems with balance and coordination.  Psych: Denies anxiety, depression, SI/HI.  No other specific complaints in a complete review of systems (except as listed in HPI above).  Objective:   Physical Exam BP 128/82 (BP Location: Left Arm, Patient Position: Sitting, Cuff Size: Normal)   Ht 4\' 11"  (1.499 m)   Wt 184 lb (83.5 kg)   BMI 37.16 kg/m    Wt Readings from Last 3 Encounters:  07/02/23 180 lb (81.6 kg)  01/22/23 178 lb (80.7 kg)  08/27/22 174 lb (78.9 kg)     General: Appears her stated age, obese, in NAD. HEENT: Head: normal shape and size; Eyes: sclera white, no icterus, conjunctiva pink, PERRLA and EOMs intact;  Neck:  Neck supple, trachea midline. No masses, lumps or thyromegaly present.  Cardiovascular: Normal rate and rhythm. S1,S2 noted.  No murmur, rubs or gallops noted. No JVD or BLE edema.  Pulmonary/Chest: Normal effort and positive vesicular breath sounds. No respiratory distress. No wheezes, rales or ronchi noted.  Musculoskeletal: No difficulty with gait.  Neurological: Alert and oriented. Coordination normal.    BMET    Component Value Date/Time   NA 138 07/02/2023 0856   NA 137 02/20/2013 2253   K 4.8 07/02/2023 0856   K 4.1 02/20/2013 2253   CL 103 07/02/2023 0856   CL 107 02/20/2013 2253   CO2 25 07/02/2023 0856   CO2 24 02/20/2013 2253   GLUCOSE 118 (H) 07/02/2023 0856   GLUCOSE 96 02/20/2013 2253   BUN 10 07/02/2023 0856   BUN 9 02/20/2013 2253   CREATININE 0.57 07/02/2023 0856   CALCIUM 9.9 07/02/2023 0856   CALCIUM 9.2 02/20/2013 2253   GFRNONAA >60 11/20/2021 1153   GFRNONAA 123 06/29/2018 0827   GFRAA >60 06/14/2020 0416   GFRAA 142 06/29/2018 0827    Lipid Panel     Component Value Date/Time   CHOL 203 (H) 07/02/2023 0856   TRIG 79 07/02/2023 0856   HDL 57 07/02/2023 0856   CHOLHDL 3.6 07/02/2023 0856   VLDL 35 (H) 03/31/2017 0849   LDLCALC 128 (H) 07/02/2023 0856    CBC    Component Value Date/Time   WBC 9.4 07/02/2023 0856   RBC 4.73 07/02/2023 0856   HGB 14.1 07/02/2023 0856   HGB 14.0 02/20/2013 2253   HCT 41.5 07/02/2023 0856   HCT 41.1 02/20/2013 2253   PLT 657 (H) 07/02/2023 0856   PLT 625 (H) 02/20/2013 2253   MCV 87.7 07/02/2023 0856   MCV 72 (L) 02/20/2013 2253   MCH 29.8 07/02/2023 0856   MCHC 34.0 07/02/2023 0856   RDW 13.8 07/02/2023 0856   RDW 19.0 (H) 02/20/2013 2253   LYMPHSABS 5.7 (H) 06/14/2020 0416   MONOABS 1.4 (H) 06/14/2020 0416   EOSABS 0.3 06/14/2020 0416    BASOSABS 0.1 06/14/2020 0416    Hgb A1C Lab Results  Component Value Date   HGBA1C 5.5 07/02/2023            Assessment & Plan:     RTC in 6 months for your annual exam Nicki Reaper, NP

## 2023-12-31 NOTE — Assessment & Plan Note (Signed)
Continue valacyclovir as needed

## 2023-12-31 NOTE — Patient Instructions (Signed)

## 2023-12-31 NOTE — Assessment & Plan Note (Signed)
Encourage weight loss as this can help reduce joint pain Okay to continue Tylenol or ibuprofen OTC as needed

## 2023-12-31 NOTE — Assessment & Plan Note (Signed)
CBC today.

## 2023-12-31 NOTE — Assessment & Plan Note (Signed)
Encourage diet and exercise for weight loss

## 2023-12-31 NOTE — Assessment & Plan Note (Signed)
TSH today Not medicated

## 2024-01-01 LAB — LIPID PANEL
Cholesterol: 222 mg/dL — ABNORMAL HIGH (ref <200)
HDL: 55 mg/dL (ref >=50)
LDL Cholesterol (Calc): 148 mg/dL — ABNORMAL HIGH
Non-HDL Cholesterol (Calc): 167 mg/dL — ABNORMAL HIGH (ref <130)
Total CHOL/HDL Ratio: 4 (calc) (ref <5.0)
Triglycerides: 84 mg/dL (ref <150)

## 2024-01-01 LAB — CBC
HCT: 43.7 % (ref 35.0–45.0)
Hemoglobin: 14.6 g/dL (ref 11.7–15.5)
MCH: 29.4 pg (ref 27.0–33.0)
MCHC: 33.4 g/dL (ref 32.0–36.0)
MCV: 88.1 fL (ref 80.0–100.0)
MPV: 10.9 fL (ref 7.5–12.5)
Platelets: 668 10*3/uL — ABNORMAL HIGH (ref 140–400)
RBC: 4.96 10*6/uL (ref 3.80–5.10)
RDW: 13.7 % (ref 11.0–15.0)
WBC: 11.6 10*3/uL — ABNORMAL HIGH (ref 3.8–10.8)

## 2024-01-01 LAB — COMPLETE METABOLIC PANEL WITH GFR
AG Ratio: 1.7 (calc) (ref 1.0–2.5)
ALT: 23 U/L (ref 6–29)
AST: 18 U/L (ref 10–35)
Albumin: 4.6 g/dL (ref 3.6–5.1)
Alkaline phosphatase (APISO): 85 U/L (ref 31–125)
BUN: 15 mg/dL (ref 7–25)
CO2: 26 mmol/L (ref 20–32)
Calcium: 10.2 mg/dL (ref 8.6–10.2)
Chloride: 104 mmol/L (ref 98–110)
Creat: 0.57 mg/dL (ref 0.50–0.99)
Globulin: 2.7 g/dL (ref 1.9–3.7)
Glucose, Bld: 119 mg/dL — ABNORMAL HIGH (ref 65–99)
Potassium: 4.9 mmol/L (ref 3.5–5.3)
Sodium: 139 mmol/L (ref 135–146)
Total Bilirubin: 0.4 mg/dL (ref 0.2–1.2)
Total Protein: 7.3 g/dL (ref 6.1–8.1)
eGFR: 113 mL/min/{1.73_m2} (ref >=60)

## 2024-01-01 LAB — HEMOGLOBIN A1C
Hgb A1c MFr Bld: 5.6 %{Hb} (ref <5.7)
Mean Plasma Glucose: 114 mg/dL
eAG (mmol/L): 6.3 mmol/L

## 2024-01-01 LAB — TSH: TSH: 0.98 m[IU]/L

## 2024-01-03 ENCOUNTER — Encounter: Payer: Self-pay | Admitting: Internal Medicine

## 2024-07-07 ENCOUNTER — Encounter: Payer: Self-pay | Admitting: Internal Medicine

## 2024-07-07 ENCOUNTER — Ambulatory Visit (INDEPENDENT_AMBULATORY_CARE_PROVIDER_SITE_OTHER): Payer: No Typology Code available for payment source | Admitting: Internal Medicine

## 2024-07-07 VITALS — BP 134/80 | Ht 59.0 in | Wt 176.4 lb

## 2024-07-07 DIAGNOSIS — Z1231 Encounter for screening mammogram for malignant neoplasm of breast: Secondary | ICD-10-CM | POA: Diagnosis not present

## 2024-07-07 DIAGNOSIS — Z1211 Encounter for screening for malignant neoplasm of colon: Secondary | ICD-10-CM | POA: Diagnosis not present

## 2024-07-07 DIAGNOSIS — R739 Hyperglycemia, unspecified: Secondary | ICD-10-CM

## 2024-07-07 DIAGNOSIS — E66812 Obesity, class 2: Secondary | ICD-10-CM

## 2024-07-07 DIAGNOSIS — Z0001 Encounter for general adult medical examination with abnormal findings: Secondary | ICD-10-CM | POA: Diagnosis not present

## 2024-07-07 DIAGNOSIS — Z6835 Body mass index (BMI) 35.0-35.9, adult: Secondary | ICD-10-CM

## 2024-07-07 DIAGNOSIS — E7800 Pure hypercholesterolemia, unspecified: Secondary | ICD-10-CM | POA: Diagnosis not present

## 2024-07-07 DIAGNOSIS — E039 Hypothyroidism, unspecified: Secondary | ICD-10-CM

## 2024-07-07 NOTE — Patient Instructions (Signed)

## 2024-07-07 NOTE — Assessment & Plan Note (Signed)
 Encourage diet and exercise for weight loss

## 2024-07-07 NOTE — Progress Notes (Signed)
 Subjective:    Patient ID: Mallory Deleon, female    DOB: 05-12-1976, 48 y.o.   MRN: [id number]  HPI  Patient presents to clinic today for her annual exam.  Flu: 06/2017 Tetanus: 03/2017 COVID: Never Prevnar: never Pap smear: Hysterectomy Mammogram: 07/2023 Colon screening: 07/2021, 3 years Vision screening: annually Dentist: biannually  Diet: She does eat meat. She consumes fruits and veggies. She does eat some fried foods. She drinks mostly coffee, water, soda Exercise: None  Review of Systems     Past Medical History:  Diagnosis Date   Kidney stones 2010   Thyroid  disease    Wears dentures    partial upper    No current outpatient medications on file.   No current facility-administered medications for this visit.    Allergies  Allergen Reactions   Naproxen Sodium Itching and Rash   Sulfa Antibiotics Hives and Rash    Family History  Problem Relation Age of Onset   Diabetes Mother    Diabetes Father    Heart disease Father        CABGx3   Hypothyroidism Sister    Obesity Sister    Hereditary spherocytosis Daughter    Hereditary spherocytosis Son    Breast cancer Neg Hx     Social History   Socioeconomic History   Marital status: Single    Spouse name: Not on file   Number of children: 3   Years of education: Not on file   Highest education level: Not on file  Occupational History   Not on file  Tobacco Use   Smoking status: Former    Current packs/day: 0.50    Average packs/day: 0.5 packs/day for 25.0 years (12.5 ttl pk-yrs)    Types: Cigarettes   Smokeless tobacco: Never   Tobacco comments:    smokes when she is bored and stressed  Vaping Use   Vaping status: Never Used  Substance and Sexual Activity   Alcohol use: No    Comment: rarely   Drug use: Yes    Frequency: 6.0 times per week    Types: Marijuana   Sexual activity: Yes  Other Topics Concern   Not on file  Social History Narrative   Not on file   Social Drivers of Health    Financial Resource Strain: Not on file  Food Insecurity: Not on file  Transportation Needs: Not on file  Physical Activity: Not on file  Stress: Not on file  Social Connections: Not on file  Intimate Partner Violence: Not on file     Constitutional: Denies fever, malaise, fatigue, headache or abrupt weight changes.  HEENT: Denies eye pain, eye redness, ear pain, ringing in the ears, wax buildup, runny nose, nasal congestion, bloody nose, or sore throat. Respiratory: Denies difficulty breathing, shortness of breath, cough or sputum production.   Cardiovascular: Denies chest pain, chest tightness, palpitations or swelling in the hands or feet.  Gastrointestinal: Denies abdominal pain, bloating, constipation, diarrhea or blood in the stool.  GU: Denies urgency, frequency, pain with urination, burning sensation, blood in urine, odor or discharge. Musculoskeletal:  Denies decrease in range of motion, difficulty with gait, muscle pain or joint pain or swelling.  Skin: Denies redness, rashes, lesions or ulcercations.  Neurological: Pt reports hot flashes. Denies dizziness, difficulty with memory, difficulty with speech or problems with balance and coordination.  Psych: Denies anxiety, depression, SI/HI.  No other specific complaints in a complete review of systems (except as listed in HPI above).  Objective:   Physical Exam  BP (!) 140/84 (BP Location: Right Arm, Patient Position: Sitting, Cuff Size: Normal)   Ht 4' 11 (1.499 m)   Wt 176 lb 6.4 oz (80 kg)   BMI 35.63 kg/m    Wt Readings from Last 3 Encounters:  12/31/23 184 lb (83.5 kg)  07/02/23 180 lb (81.6 kg)  01/22/23 178 lb (80.7 kg)    General: Appears her stated age, obese, in NAD. Skin: Warm, dry and intact.  HEENT: Head: normal shape and size; Eyes: sclera white, no icterus, conjunctiva pink, PERRLA and EOMs intact;  Neck:  Neck supple, trachea midline. Thyromegaly noted. Cardiovascular: Normal rate and rhythm.  S1,S2 noted.  No murmur, rubs or gallops noted. No JVD or BLE edema. Pulmonary/Chest: Normal effort and positive vesicular breath sounds. No respiratory distress. No wheezes, rales or ronchi noted.  Abdomen:  Normal bowel sounds.  Musculoskeletal: Strength 5/5 BUE/BLE.  No difficulty with gait.  Neurological: Alert and oriented. Cranial nerves II-XII grossly intact. Coordination normal.  Psychiatric: Mood and affect normal. Behavior is normal. Judgment and thought content normal.    BMET    Component Value Date/Time   NA 139 12/31/2023 0852   NA 137 02/20/2013 2253   K 4.9 12/31/2023 0852   K 4.1 02/20/2013 2253   CL 104 12/31/2023 0852   CL 107 02/20/2013 2253   CO2 26 12/31/2023 0852   CO2 24 02/20/2013 2253   GLUCOSE 119 (H) 12/31/2023 0852   GLUCOSE 96 02/20/2013 2253   BUN 15 12/31/2023 0852   BUN 9 02/20/2013 2253   CREATININE 0.57 12/31/2023 0852   CALCIUM 10.2 12/31/2023 0852   CALCIUM 9.2 02/20/2013 2253   GFRNONAA >60 11/20/2021 1153   GFRNONAA 123 06/29/2018 0827   GFRAA >60 06/14/2020 0416   GFRAA 142 06/29/2018 0827    Lipid Panel     Component Value Date/Time   CHOL 222 (H) 12/31/2023 0852   TRIG 84 12/31/2023 0852   HDL 55 12/31/2023 0852   CHOLHDL 4.0 12/31/2023 0852   VLDL 35 (H) 03/31/2017 0849   LDLCALC 148 (H) 12/31/2023 0852    CBC    Component Value Date/Time   WBC 11.6 (H) 12/31/2023 0852   RBC 4.96 12/31/2023 0852   HGB 14.6 12/31/2023 0852   HGB 14.0 02/20/2013 2253   HCT 43.7 12/31/2023 0852   HCT 41.1 02/20/2013 2253   PLT 668 (H) 12/31/2023 0852   PLT 625 (H) 02/20/2013 2253   MCV 88.1 12/31/2023 0852   MCV 72 (L) 02/20/2013 2253   MCH 29.4 12/31/2023 0852   MCHC 33.4 12/31/2023 0852   RDW 13.7 12/31/2023 0852   RDW 19.0 (H) 02/20/2013 2253   LYMPHSABS 5.7 (H) 06/14/2020 0416   MONOABS 1.4 (H) 06/14/2020 0416   EOSABS 0.3 06/14/2020 0416   BASOSABS 0.1 06/14/2020 0416    Hgb A1C Lab Results  Component Value Date   HGBA1C  5.6 12/31/2023           Assessment & Plan:   Preventative health maintenance:  Encouraged her to get a flu shot in fall Tetanus UTD Encouraged her to get her COVID-vaccine She declines prevnar She no longer needs to screen for cervical cancer Mammogram ordered-she will call to schedule Referral to GI for screening colonoscopy Encouraged her to consume a balanced diet and exercise regimen Advised her to see an eye doctor and dentist annually We will check CBC, c-Met, TSH, lipid, A1c  RTC in 6 months,  follow-up chronic conditions Angeline Laura, NP

## 2024-07-08 LAB — CBC
HCT: 39.7 % (ref 35.0–45.0)
Hemoglobin: 13.6 g/dL (ref 11.7–15.5)
MCH: 30 pg (ref 27.0–33.0)
MCHC: 34.3 g/dL (ref 32.0–36.0)
MCV: 87.4 fL (ref 80.0–100.0)
MPV: 10.2 fL (ref 7.5–12.5)
Platelets: 610 Thousand/uL — ABNORMAL HIGH (ref 140–400)
RBC: 4.54 Million/uL (ref 3.80–5.10)
RDW: 13.6 % (ref 11.0–15.0)
WBC: 12.9 Thousand/uL — ABNORMAL HIGH (ref 3.8–10.8)

## 2024-07-08 LAB — COMPREHENSIVE METABOLIC PANEL WITH GFR
AG Ratio: 2.1 (calc) (ref 1.0–2.5)
ALT: 18 U/L (ref 6–29)
AST: 17 U/L (ref 10–35)
Albumin: 4.8 g/dL (ref 3.6–5.1)
Alkaline phosphatase (APISO): 93 U/L (ref 31–125)
BUN: 9 mg/dL (ref 7–25)
CO2: 26 mmol/L (ref 20–32)
Calcium: 10.2 mg/dL (ref 8.6–10.2)
Chloride: 102 mmol/L (ref 98–110)
Creat: 0.56 mg/dL (ref 0.50–0.99)
Globulin: 2.3 g/dL (ref 1.9–3.7)
Glucose, Bld: 78 mg/dL (ref 65–99)
Potassium: 4.2 mmol/L (ref 3.5–5.3)
Sodium: 139 mmol/L (ref 135–146)
Total Bilirubin: 0.6 mg/dL (ref 0.2–1.2)
Total Protein: 7.1 g/dL (ref 6.1–8.1)
eGFR: 113 mL/min/1.73m2 (ref >=60)

## 2024-07-08 LAB — HEMOGLOBIN A1C
Hgb A1c MFr Bld: 5.7 % — ABNORMAL HIGH (ref <5.7)
Mean Plasma Glucose: 117 mg/dL
eAG (mmol/L): 6.5 mmol/L

## 2024-07-08 LAB — LIPID PANEL
Cholesterol: 237 mg/dL — ABNORMAL HIGH (ref <200)
HDL: 55 mg/dL (ref >=50)
LDL Cholesterol (Calc): 163 mg/dL — ABNORMAL HIGH
Non-HDL Cholesterol (Calc): 182 mg/dL — ABNORMAL HIGH (ref <130)
Total CHOL/HDL Ratio: 4.3 (calc) (ref <5.0)
Triglycerides: 87 mg/dL (ref <150)

## 2024-07-08 LAB — TSH: TSH: 1.22 m[IU]/L

## 2024-07-10 ENCOUNTER — Ambulatory Visit: Payer: Self-pay | Admitting: Internal Medicine

## 2024-09-12 ENCOUNTER — Ambulatory Visit: Admitting: Pediatrics

## 2024-09-12 ENCOUNTER — Ambulatory Visit: Payer: Self-pay | Admitting: Pediatrics

## 2024-09-12 ENCOUNTER — Ambulatory Visit: Payer: Self-pay

## 2024-09-12 ENCOUNTER — Encounter: Payer: Self-pay | Admitting: Pediatrics

## 2024-09-12 VITALS — BP 152/90 | HR 77 | Temp 98.2°F | Wt 180.0 lb

## 2024-09-12 DIAGNOSIS — J039 Acute tonsillitis, unspecified: Secondary | ICD-10-CM

## 2024-09-12 DIAGNOSIS — R030 Elevated blood-pressure reading, without diagnosis of hypertension: Secondary | ICD-10-CM

## 2024-09-12 DIAGNOSIS — J069 Acute upper respiratory infection, unspecified: Secondary | ICD-10-CM | POA: Diagnosis not present

## 2024-09-12 MED ORDER — AMOXICILLIN-POT CLAVULANATE 875-125 MG PO TABS: 1.0000 | ORAL_TABLET | Freq: Two times a day (BID) | ORAL | 0 refills | Status: AC

## 2024-09-12 NOTE — Progress Notes (Signed)
 Office Visit  BP (!) 152/90   Pulse 77   Temp 98.2 F (36.8 C) (Oral)   Wt 180 lb (81.6 kg)   SpO2 98%   BMI 36.36 kg/m    Subjective:    Patient ID: Mallory Deleon, female    DOB: 29-Feb-1976, 47 y.o.   MRN: [id number]  HPI: Mallory Deleon is a 48 y.o. female  Chief Complaint  Patient presents with   Sore Throat   Fever    Discussed the use of AI scribe software for clinical note transcription with the patient, who gave verbal consent to proceed.  History of Present Illness   Mallory Deleon is a 48 year old female who presents with severe sore throat and cold-like symptoms.  She has a severe sore throat, describing it as 'killing me', which is her most bothersome symptom. She also experiences coughing up thick mucus with a little bit of blood. She has had fevers at home and uses ibuprofen  for symptom relief, although she generally avoids medication.  She has difficulty resting due to body aches, stating that she can only sleep for about an hour before waking up due to discomfort. Her ears feel painful with a lot of pressure, especially when yawning. Her nose is very inflamed, and she experiences tenderness and headaches in the sinus area.  She reports swollen lymph nodes under her neck, which she describes as very swollen. She has a history of elevated blood pressure, noting a reading of 140/80 during a physical in August, and she has a blood pressure cuff at home for monitoring. 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        Relevant past medical, surgical, family and social history reviewed and updated as indicated. Interim medical history since our last visit reviewed. Allergies and medications reviewed and updated.  ROS per HPI unless specifically indicated above     Objective:    BP (!) 152/90   Pulse 77   Temp 98.2 F (36.8 C) (Oral)   Wt 180 lb (81.6 kg)   SpO2 98%   BMI 36.36 kg/m   Wt Readings from Last 3 Encounters:  09/12/24 180 lb (81.6 kg)  07/07/24 176 lb  6.4 oz (80 kg)  12/31/23 184 lb (83.5 kg)     Physical Exam Constitutional:      General: She is not in acute distress. HENT:     Nose: Congestion present.     Mouth/Throat:     Pharynx: Posterior oropharyngeal erythema present.     Tonsils: Tonsillar exudate present. No tonsillar abscesses. 2+ on the right. 2+ on the left.  Neurological:     Mental Status: She is alert.         09/12/2024    1:28 PM 07/07/2024    1:21 PM 12/31/2023    8:35 AM 07/02/2023    8:37 AM 01/22/2023    9:41 AM  Depression screen PHQ 2/9  Decreased Interest 0 0 0 0 0  Down, Depressed, Hopeless 0 0 0 0 0  PHQ - 2 Score 0 0 0 0 0  Altered sleeping 1 0     Tired, decreased energy 1 1     Change in appetite 0 0     Feeling bad or failure about yourself  0 0     Trouble concentrating 0 0     Moving slowly or fidgety/restless 0 0     Suicidal thoughts 0 0     PHQ-9 Score  2 1     Difficult doing work/chores Not difficult at all Not difficult at all          09/12/2024    1:28 PM 07/07/2024    1:21 PM 12/31/2023    8:35 AM 07/02/2023    8:37 AM  GAD 7 : Generalized Anxiety Score  Nervous, Anxious, on Edge 0 0 0 0  Control/stop worrying 0 0 0 0  Worry too much - different things 0 0 0 0  Trouble relaxing 0 0 0 0  Restless 0 0 0 0  Easily annoyed or irritable 0 0 0 0  Afraid - awful might happen 0 0 0 0  Total GAD 7 Score 0 0 0 0  Anxiety Difficulty Not difficult at all  Not difficult at all Not difficult at all       Assessment & Plan:  Assessment & Plan   Tonsillitis Upper respiratory tract infection, unspecified type Acute upper respiratory infection with severe sore throat, swollen lymph nodes, and fever. Differential includes streptococcal pharyngitis or other viral URI. Her tonsils are very erythematous and have several exudates present. COVID-19 test pending. Negative rapid strep, flu swab but based on exam will start strep treatment, culture pending.  - Augmentin  sent as below.  -  Advise rest and hydration. - Recommend ibuprofen  for fever and pain. -     Rapid Strep Screen ([facility] ONLY) -     Novel Coronavirus, NAA ([facility]) -     Veritor Flu A/B Waived -     Amoxicillin -Pot Clavulanate; Take 1 tablet by mouth 2 (two) times daily for 5 days.  Dispense: 10 tablet; Refill: 0  Elevated blood pressure reading Blood pressure elevated at 140/80 mmHg. - Advise home blood pressure monitoring. - Instruct follow-up with primary care for management. - Document for primary care provider to call and check in.   Follow up plan: Return if symptoms worsen or fail to improve.  Hadassah SHAUNNA Nett, MD   Approximately 30 minutes spent on patient encounter today including assessment, counseling, diagnosing, treatment plan development, and charting.

## 2024-09-12 NOTE — Telephone Encounter (Signed)
 No appointments available at patient's PCP office Appointment made for today 09/12/2024 at 1:20pm with Dr. Hadassah Nett at [facility]  FYI Only or Action Required?: FYI only for provider.  Patient was last seen in primary care on 07/07/2024 by Antonette Angeline ORN, NP.  Called Nurse Triage reporting Sore Throat.  Symptoms began 2 days ago.  Interventions attempted: OTC medications: ibuprofen  and Rest, hydration, or home remedies.  Symptoms are: gradually worsening.  Triage Disposition: See Physician Within 24 Hours  Patient/caregiver understands and will follow disposition?: Yes                  Copied from CRM #[id number]. Topic: Clinical - Red Word Triage >> Sep 12, 2024 11:20 AM Mallory Deleon wrote: [facility] that prompted transfer to Nurse Triage: Patient thinks she has strep throat. Since early Monday morning has had, body aches, fever of 99.9, headache, and sore throat. Reason for Disposition  SEVERE throat pain (e.g., excruciating)  Answer Assessment - Initial Assessment Questions Went to a pumpkin patch with her kids---around a lot of kids  No available appointments at patient's PCP office today  Patient is advised to call us  back if anything changes or with any further questions/concerns. Patient is advised that if anything worsens to go to the Emergency Room. Patient verbalized understanding.   1. ONSET: When did the throat start hurting? (Hours or days ago)      2 days ago 2. SEVERITY: How bad is the sore throat? (Scale 1-10; mild, moderate or severe)     9 3. STREP EXPOSURE: Has there been any exposure to strep within the past week? If Yes, ask: What type of contact occurred?      Went to a pumpkin patch with her kids---around a lot of kids 4.  VIRAL SYMPTOMS: Are there any symptoms of a cold, such as a runny nose, cough, hoarse voice or red eyes?      Body aches, thick mucous, 5. FEVER: Do you have a fever? If Yes, ask: What is  your temperature, how was it measured, and when did it start?     99.9 6. PUS ON THE TONSILS: Is there pus on the tonsils in the back of your throat?     N/a 7. OTHER SYMPTOMS: Do you have any other symptoms? (e.g., difficulty breathing, headache, rash)     Congestion, body aches 8. PREGNANCY: Is there any chance you are pregnant? When was your last menstrual period?     No--hysterectomy  Protocols used: Sore Throat-A-AH

## 2024-09-12 NOTE — Patient Instructions (Signed)
 Most cold symptoms last up to 2 weeks, but cough can sometimes linger up to 4 weeks.  However if your symtpoms get WORSE - like you develop fevers or get more shortness of breath, then call your clinic as you may need to be evaluated.   Aches and Pains Acetaminophen (Tylenol): 1000mg  ("extra strength" tablets are 500mg , so take 2) every 8 hours if needed  Ibuprofen (Advil/Motrin) 400-800mg  (comes in 200mg  pills OTC, so 2-4 pills) every 8 hours. - Avoid in excess if cardiac blood pressure issues  Sore Throat:  See Aches and Pains meds above, also Sore throat sprays and lozenges may also help.   Cough:  Honey 2 TBS every 4-6 hours if needed.  Robitussin DM syrup or generic equivalent which has (guaifenesin = an expectorant to help you get stuff up + dextromethorphan (DM) = cough supressant). You can also get this in tablet formula (like Mucinex DM or generic equivalent).  If you have asthma or are wheezing and have a tight chest, then albuterol inhaler (Ventolin, ProAir) may be helpful - you need a prescription for this.   Congestion:  oxymetazoline (Afrin) nasal stray: 2 sprays each nostril every 12 hours. Don't use more than 3 days in a row to avoid building a tolerance to it.  Sinus rinse (neti pot) high volume sinus rinse can help open up your sinuses and be helpful, especially if you're having sinus pressure and headaches.   Other:  Umcka (pelargonium sidoides extract) can to shorten cold symptoms (can be hard to find, but [employer] carries it: brand name Umcka ColdCare from [employer]). Works best if you start taking at earliest signs of cold symptoms.  Andrographis paniculata is another herbal remedy with less evidence, but may reduce common cold symptoms in adults.  zinc acetate lozenges >= 80 mg/day reduces duration but not severity of cold symptoms in adults, but it is associated with bad taste and nausea Heated humidified air may reduce cold symptoms, so try using a humidifier -  especially in your bedroom at night.  Stay hydrated! Aim to drink at least 2 liters of water daily.   What doesn't work (but lots of folks think might) Vitamin C: bummer right?! But there's no evidence that high dose vitamin C will help cold symptoms. Antibiotics: colds come from viruses - which antibiotics don't kill . . . But they will kill your health/natural bacteria in your body and can build resistant 'super bugs'

## 2024-09-14 LAB — NOVEL CORONAVIRUS, NAA: SARS-CoV-2, NAA: NOT DETECTED

## 2024-09-15 LAB — VERITOR FLU A/B WAIVED
Influenza A: NEGATIVE
Influenza B: NEGATIVE

## 2024-09-15 LAB — RAPID STREP SCREEN (MED CTR MEBANE ONLY): Strep Gp A Ag, IA W/Reflex: NEGATIVE

## 2024-09-15 LAB — CULTURE, GROUP A STREP

## 2025-01-05 ENCOUNTER — Ambulatory Visit: Admitting: Internal Medicine
